# Patient Record
Sex: Female | Born: 1965 | Race: White | Hispanic: No | Marital: Married | Smoking: Never smoker
Health system: Southern US, Community
[De-identification: ages and names within clinical notes are randomized; demographics above are authoritative.]

## PROBLEM LIST (undated history)

## (undated) DIAGNOSIS — H409 Unspecified glaucoma: Secondary | ICD-10-CM

## (undated) DIAGNOSIS — M8580 Other specified disorders of bone density and structure, unspecified site: Secondary | ICD-10-CM

## (undated) DIAGNOSIS — T7840XA Allergy, unspecified, initial encounter: Secondary | ICD-10-CM

## (undated) DIAGNOSIS — D649 Anemia, unspecified: Secondary | ICD-10-CM

## (undated) DIAGNOSIS — H269 Unspecified cataract: Secondary | ICD-10-CM

## (undated) DIAGNOSIS — E785 Hyperlipidemia, unspecified: Secondary | ICD-10-CM

## (undated) HISTORY — DX: Unspecified cataract: H26.9

## (undated) HISTORY — DX: Other specified disorders of bone density and structure, unspecified site: M85.80

## (undated) HISTORY — DX: Allergy, unspecified, initial encounter: T78.40XA

## (undated) HISTORY — DX: Unspecified glaucoma: H40.9

## (undated) HISTORY — DX: Anemia, unspecified: D64.9

## (undated) HISTORY — PX: RETINAL DETACHMENT SURGERY: SHX105

## (undated) HISTORY — DX: Hyperlipidemia, unspecified: E78.5

---

## 2001-11-22 ENCOUNTER — Other Ambulatory Visit: Admission: RE | Admit: 2001-11-22 | Discharge: 2001-11-22 | Payer: Self-pay | Admitting: Family Medicine

## 2002-01-09 ENCOUNTER — Ambulatory Visit (HOSPITAL_COMMUNITY): Admission: RE | Admit: 2002-01-09 | Discharge: 2002-01-09 | Payer: Self-pay | Admitting: *Deleted

## 2002-12-11 ENCOUNTER — Encounter: Admission: RE | Admit: 2002-12-11 | Discharge: 2002-12-11 | Payer: Self-pay | Admitting: Obstetrics and Gynecology

## 2003-02-27 ENCOUNTER — Inpatient Hospital Stay (HOSPITAL_COMMUNITY): Admission: AD | Admit: 2003-02-27 | Discharge: 2003-03-04 | Payer: Self-pay | Admitting: Obstetrics and Gynecology

## 2003-04-08 ENCOUNTER — Other Ambulatory Visit: Admission: RE | Admit: 2003-04-08 | Discharge: 2003-04-08 | Payer: Self-pay | Admitting: Obstetrics and Gynecology

## 2004-06-16 ENCOUNTER — Other Ambulatory Visit: Admission: RE | Admit: 2004-06-16 | Discharge: 2004-06-16 | Payer: Self-pay | Admitting: Obstetrics and Gynecology

## 2004-06-26 ENCOUNTER — Encounter: Admission: RE | Admit: 2004-06-26 | Discharge: 2004-06-26 | Payer: Self-pay | Admitting: Obstetrics and Gynecology

## 2004-12-11 ENCOUNTER — Other Ambulatory Visit: Admission: RE | Admit: 2004-12-11 | Discharge: 2004-12-11 | Payer: Self-pay | Admitting: Obstetrics and Gynecology

## 2004-12-22 ENCOUNTER — Encounter: Admission: RE | Admit: 2004-12-22 | Discharge: 2004-12-22 | Payer: Self-pay | Admitting: Obstetrics and Gynecology

## 2007-04-13 ENCOUNTER — Encounter: Admission: RE | Admit: 2007-04-13 | Discharge: 2007-04-13 | Payer: Self-pay | Admitting: Obstetrics and Gynecology

## 2007-06-06 ENCOUNTER — Inpatient Hospital Stay (HOSPITAL_COMMUNITY): Admission: RE | Admit: 2007-06-06 | Discharge: 2007-06-08 | Payer: Self-pay | Admitting: Obstetrics and Gynecology

## 2009-12-31 ENCOUNTER — Encounter: Admission: RE | Admit: 2009-12-31 | Discharge: 2010-03-06 | Payer: Self-pay | Admitting: Endocrinology

## 2010-06-27 ENCOUNTER — Encounter: Payer: Self-pay | Admitting: Obstetrics and Gynecology

## 2010-06-27 ENCOUNTER — Other Ambulatory Visit (HOSPITAL_COMMUNITY): Payer: Self-pay | Admitting: Obstetrics and Gynecology

## 2010-06-27 DIAGNOSIS — O24919 Unspecified diabetes mellitus in pregnancy, unspecified trimester: Secondary | ICD-10-CM

## 2010-07-13 ENCOUNTER — Ambulatory Visit (HOSPITAL_COMMUNITY)
Admission: RE | Admit: 2010-07-13 | Discharge: 2010-07-13 | Disposition: A | Discharge status: Home / Self Care | Payer: BC Managed Care – PPO | Source: Ambulatory Visit | Attending: Obstetrics and Gynecology | Admitting: Obstetrics and Gynecology

## 2010-07-13 DIAGNOSIS — O34219 Maternal care for unspecified type scar from previous cesarean delivery: Secondary | ICD-10-CM | POA: Insufficient documentation

## 2010-07-13 DIAGNOSIS — O09529 Supervision of elderly multigravida, unspecified trimester: Secondary | ICD-10-CM | POA: Insufficient documentation

## 2010-07-13 DIAGNOSIS — O99210 Obesity complicating pregnancy, unspecified trimester: Secondary | ICD-10-CM | POA: Insufficient documentation

## 2010-07-13 DIAGNOSIS — Z363 Encounter for antenatal screening for malformations: Secondary | ICD-10-CM | POA: Insufficient documentation

## 2010-07-13 DIAGNOSIS — E669 Obesity, unspecified: Secondary | ICD-10-CM | POA: Insufficient documentation

## 2010-07-13 DIAGNOSIS — O358XX0 Maternal care for other (suspected) fetal abnormality and damage, not applicable or unspecified: Secondary | ICD-10-CM | POA: Insufficient documentation

## 2010-07-13 DIAGNOSIS — Z1389 Encounter for screening for other disorder: Secondary | ICD-10-CM | POA: Insufficient documentation

## 2010-07-13 DIAGNOSIS — O99810 Abnormal glucose complicating pregnancy: Secondary | ICD-10-CM | POA: Insufficient documentation

## 2010-07-13 DIAGNOSIS — O24919 Unspecified diabetes mellitus in pregnancy, unspecified trimester: Secondary | ICD-10-CM

## 2010-10-20 NOTE — Op Note (Signed)
NAMEGREIDY, Tara Oliver                   ACCOUNT NO.:  [id number]   MEDICAL RECORD NO.:  [id number]           PATIENT TYPE:   LOCATION:                                 FACILITY:   PHYSICIAN:  Michelle L. Grewal, M.D.DATE OF BIRTH:  09/28/1965   DATE OF PROCEDURE:  06/06/2007  DATE OF DISCHARGE:                               OPERATIVE REPORT   PREOPERATIVE DIAGNOSES:  1. Intrauterine pregnancy at 39 weeks.  2. Previous Cesarean section.  3. Gestational diabetes.   POSTOPERATIVE DIAGNOSES:  1. Intrauterine pregnancy at 39 weeks.  2. Previous Cesarean section.  3. Gestational diabetes.   PROCEDURE:  Repeat low transverse cesarean section.   SURGEON:  Michelle L. Vincente Poli, M.D.   ASSISTANT:  Guy Sandifer. Henderson Cloud, M.D.   ANESTHESIA:  Spinal.   FINDINGS:  Female infant, Apgars 9 at one minute and 9 at five minutes.   ESTIMATED BLOOD LOSS:  Estimated blood loss 500 mL.   DRAINS:  Foley.   PROCEDURE:  Patient taken to the operating room where spinal was placed  by Dr. Jean Rosenthal and she was prepped and draped.  A low transverse  incision was made after Foley catheter was inserted.  It was carried  down to the fascia.  The fascia was scored in the midline and extended  laterally.  The rectus muscles were separated in the midline and the  peritoneum was entered bluntly.  Peritoneal incision was then stretched.  The lower uterine segment was identified.  The bladder flap was created  sharply and then digitally.   A low transverse incision was made in the uterus.  The uterus was  entered using a hemostat.  Amniotic fluid was clear.  The baby was in  cephalic presentation and delivered easily.  The cord was clamped and  cut.  Cord blood was obtained.  The placenta was manually removed and  noted be normal, intact with three-vessel cord.  Antibiotics and Pitocin  had been given.  The baby was a female infant, Apgars 9 at one minute  and 9 at five minutes and was cared for by the neonatal team in  the  operating room and subsequently taken to newborn nursery.  The baby was  very vigorous in the delivery room.   After the uterus was exteriorized and cleared of all clots and debris,  it was closed in two-layers used using 0 chromic in continuous running  locked stitch.  Hemostasis was excellent.  The uterus was returned to  the abdomen.  Irrigation was performed.  Hemostasis was again noted.  The peritoneum was closed using 0  Vicryl.  The fascia was then  closed using 0 Vicryl in running stitch starting at each corner and  meeting in the midline.  After irrigation of subcutaneous layer, the  skin was closed with staples.  All sponge, lap and instrument counts  were correct x2.  The patient went to recovery room in stable condition.      Michelle L. Vincente Poli, M.D.  Electronically Signed     MLG/MEDQ  D:  06/06/2007  T:  06/06/2007  Job:  [id number]

## 2010-10-23 NOTE — Discharge Summary (Signed)
Tara Oliver, CURIALE                   ACCOUNT NO.:  [id number]   MEDICAL RECORD NO.:  [id number]          PATIENT TYPE:  INP   LOCATION:  9113                          FACILITY:  [facility]   PHYSICIAN:  Duke Salvia. Marcelle Overlie, M.D.DATE OF BIRTH:  [birth year]   DATE OF ADMISSION:  06/06/2007  DATE OF DISCHARGE:  06/08/2007                               DISCHARGE SUMMARY   ADMITTING DIAGNOSES:  1. Intrauterine pregnancy at term.  2. Previous cesarean section, desires repeat.  3. Gestational diabetes, diet controlled.   DISCHARGE DIAGNOSES:  1. Status post low transverse cesarean section.  2. Viable female infant.   PROCEDURE:  Repeat low transverse cesarean section.   REASON FOR ADMISSION:  Please see written H&P.   HOSPITAL COURSE:  The patient is a 45 year old, gravida 5, para 1-0-3-1  who presents to [facility] for a scheduled cesarean  section.  The patient had had a previous cesarean delivery and desired  repeat.  During this pregnancy, the patient had gestational diabetes  which was diet controlled.  On admission, vital signs were stable.  Fetal heart tones were reactive.   The patient was then transferred to the operating room where spinal  anesthesia was administered without difficulty.  A low transverse  incision was made with delivery of a viable female infant weighing 8  pounds 13 ounces with Apgars of 9 at one minute and 9 at five minutes.  The patient tolerated the procedure well and was taken to the recovery  room in stable condition.   On postoperative day #1, the patient was without complaint.  Vital signs  were stable.  She was afebrile.  Abdomen soft.  Fundus firm and  nontender.  Abdominal dressing was noted to be clean, dry and intact.   On postoperative day #2, the patient did desire early discharge.  She  was without complaint.  Vital signs were stable.  Abdomen soft.  Fundus  firm and nontender.  Incision was clean, dry and intact.  Laboratory  findings revealed hemoglobin of 11.2.   Discharge instructions were reviewed and the patient was later  discharged home.   CONDITION ON DISCHARGE:  Stable.   DIET:  Regular as tolerated.   ACTIVITY:  No heavy lifting, no driving x2 weeks, no vaginal entry.   FOLLOW UP:  Patient is to follow up in the office in two to three days  for staple removal.  She is to call for temperature greater than 100  degrees, persistent nausea, vomiting, heavy vaginal bleeding or redness  or drainage from the incisional site.   DISCHARGE MEDICATIONS:  1. Tylox #30, one p.o. every 4 to 6 hours.  2. Motrin 600 mg every 6 hours.  3. Prenatal vitamins one p.o. daily  4. Colace one p.o. daily.      Julio Sicks, N.P.      Richard M. Marcelle Overlie, M.D.  Electronically Signed    CC/MEDQ  D:  06/24/2007  T:  06/24/2007  Job:  [id number]

## 2010-10-23 NOTE — Discharge Summary (Signed)
NAMEJAXIE, RACANELLI                             ACCOUNT NO.:  [id number]   MEDICAL RECORD NO.:  [id number]                   PATIENT TYPE:  INP   LOCATION:  9104                                 [facility]ACILITY:  [facility]   PHYSICIAN:  Guy Sandifer. Henderson Cloud, M.D.              DATE OF BIRTH:  31-Oct-1965   DATE OF ADMISSION:  02/27/2003  DATE OF DISCHARGE:  03/04/2003                                 DISCHARGE SUMMARY   ADMITTING DIAGNOSES:  1. Intrauterine pregnancy at 90 weeks estimated gestational age.  2. Gestational diabetes.  3. Large for gestational age.   DISCHARGE DIAGNOSES:  1. Status post low transverse cesarean section secondary to failure to     progress.  2. Viable female infant.   PROCEDURE:  Primary low transverse cesarean section.   REASON FOR ADMISSION:  Please see dictated H&P.   HOSPITAL COURSE:  The patient was a 45 year old white married female gravida  2 para 0 that was admitted to [facility] for a two stage  induction.  Pregnancy had been complicated by gestational diabetes which had  been controlled with diet.  Ultrasound which was performed on day prior to  admission had estimated fetal weight of 3800 grams which was in the 94th  percentile.  Cervix was noted to be a fingertip dilated, 50% effaced, at a -  2 station.  Blood pressure was normal at 120/72 with 1+ proteinuria.  PIH  labs were drawn which were within normal limits.  The patient was noted to  be positive for group B beta strep and antibiotics were administered per  protocol.  On the evening of admission Cytotec was administered for ripening  of the cervix.  Contractions were noted to be every two to five minutes.  There was a spontaneous deceleration while the patient was lying on her  right side.  IV fluid bolus was given.  External fetal monitoring was  continued with reactivity without decelerations.  No further Cytotec was  given.  On the following morning fetal heart tones were reassuring  and  Pitocin was started per low-dose Pitocin protocol.  The cervix was now  dilated to 2 cm, 80% effaced, at a -2 station.  Artificial rupture of  membranes was performed revealing clear fluid.  Low-dose Pitocin was  continued throughout the day.  Fetal heart tones were reactive.  Intrauterine pressure catheter was placed later that evening without noted  change in the cervix.  Epidural had been placed for the patient's comfort.  After several hours of documented adequate labor without change in cervix,  decision was made to proceed with a low transverse cesarean section. The  patient was transferred to the operating room where epidural was dosed to an  adequate surgical level.  A low transverse incision was made with the  delivery of a viable female infant weighing 8 pounds 11 ounces with Apgars  of  8 at one minute and 10 at five minutes.  The patient tolerated the  procedure well and was taken to the recovery room in stable condition.  On  postoperative day #1 vital signs were stable.  The patient was afebrile,  blood pressure 100/70.  Abdomen was soft with faint bowel sounds.  Fundus  was firm, U-even, and nontender.  Abdominal dressing was noted to be  partially saturated.  Abdominal dressing was partially removed without  active bleeding noted.  Lungs were clear to auscultation.  Labs revealed  hemoglobin of 10.7; platelet count of 187,000; wbc count of 12.1.  Abdominal  dressing was removed and replaced.  On postoperative day #2 vital signs were  stable.  The patient remained afebrile.  Abdomen was soft.  She had adequate  return of bowel function.  Fundus was firm and nontender.  Abdominal  dressing was removed revealing an incision that was clean, dry, and intact.  The patient was ambulating well and tolerating a regular diet without  complaints of nausea and vomiting.  On postoperative day #3 the patient was  doing well.  Abdomen was soft.  Fundus was firm and nontender.  Incision  was  clean, dry, and intact.  The baby was having some difficulty with  hypoglycemia.  On postoperative day #4 the patient did complain of a lump in  the right axilla.  Vital signs were stable, abdomen soft.  Incision was  clean, dry, and intact.  Staples were removed.  Palpable area was 2-3 cm in  the right axilla, slightly tender, without erythema, was questionable  clogged milk duct.  Warm compresses were applied and breast massage  instructions were given, and the patient was discharged home.   CONDITION ON DISCHARGE:  Good.   DIET:  Regular as tolerated..   ACTIVITY:  No heavy lifting, no driving x2 weeks, no vaginal entry.   FOLLOW-UP:  The patient is to follow up in the office in one week for an  incision check and a repeat breast exam.  She is to call for temperature  greater than 100 degrees, persistent nausea and vomiting, heavy vaginal  bleeding, and/or redness or drainage from the incisional site.   DISCHARGE MEDICATIONS:  1. Darvocet-N 100 #30 one p.o. q.4-6h. p.r.n.  2. Motrin 600 mg q.6h.  3. Prenatal vitamins one p.o. daily.  4. Colace one p.o. daily p.r.n.     Julio Sicks, N.P.                        Guy Sandifer. Henderson Cloud, M.D.    CC/MEDQ  D:  03/26/2003  T:  03/26/2003  Job:  [id number]

## 2010-10-23 NOTE — H&P (Signed)
   NAMEKALINDA, ROMANIELLO                             ACCOUNT NO.:  [id number]   MEDICAL RECORD NO.:  [id number]                   PATIENT TYPE:  INP   LOCATION:  9168                                 [facility]ACILITY:  [facility]   PHYSICIAN:  Guy Sandifer. Arleta Creek, M.D.           DATE OF BIRTH:  1965-08-23   DATE OF ADMISSION:  02/27/2003  DATE OF DISCHARGE:                                HISTORY & PHYSICAL   CHIEF COMPLAINT:  Gestational diabetes.   HISTORY OF PRESENT ILLNESS:  This patient is a 45 year old, married, white  female, G2, P0, with an EDC of March 13, 2003, who has had gestational  diabetes with good diet control.  An ultrasound on February 26, 2003,  revealed a vertex baby with an estimated fetal weight of 3836 g, which is  94th percentile.  The amniotic fluid index was 45th percentile.  The cervix  was fingertip, 50% effaced, and -2 station.  In the office today, the blood  pressure was 120/72 and there was 1+ protein in the urine.  The patient  denies CNS changes or epigastric pain.   PAST MEDICAL HISTORY:  See prenatal history and physical.   PAST SURGICAL HISTORY:  See prenatal history and physical.   SOCIAL HISTORY:  See prenatal history and physical.   FAMILY HISTORY:  See prenatal history and physical.   MEDICATIONS:  Prenatal vitamins.   ALLERGIES:  ERYTHROMYCIN leading to nausea and vomiting.   LABORATORY DATA:  Blood type B positive.  Rh antibody screen negative.  RPR  nonreactive.  Rubella immune.  Hepatitis B surface antigen negative.  Gonorrhea and chlamydia.  BBD nonreactive.   ASSESSMENT:  1. Intrauterine pregnancy at 1 weeks estimated gestational age.  2. Gestational diabetes.  3. Rule out preeclampsia.   PLAN:  Will check pH labs.  The patient is group B Streptococcus positive.  Will begin antibiotics per protocol.  Will begin two-stage induction.                                               Guy Sandifer Arleta Creek, M.D.    JET/MEDQ  D:  02/27/2003  T:   02/27/2003  Job:  [id number]

## 2010-10-23 NOTE — Op Note (Signed)
Tara Oliver, Tara Oliver                             ACCOUNT NO.:  [id number]   MEDICAL RECORD NO.:  [id number]                   PATIENT TYPE:  INP   LOCATION:  9104                                 [facility]   PHYSICIAN:  Michelle L. Vincente Poli, M.D.            DATE OF BIRTH:  Mar 28, 1966   DATE OF PROCEDURE:  02/28/2003  DATE OF DISCHARGE:                                 OPERATIVE REPORT   PREOPERATIVE DIAGNOSES:  1. Intrauterine pregnancy at 38 weeks.  2. Gestational diabetes.  3. Large for gestational age.  4. Failure to progress.   POSTOPERATIVE DIAGNOSES:  1. Intrauterine pregnancy at 38 weeks.  2. Gestational diabetes.  3. Large for gestational age.  4. Failure to progress.   PROCEDURE:  Primary low-transverse cesarean section.   SURGEON:  Michelle L. Vincente Poli, M.D.   FINDINGS:  Female infant, APGARS 9 at one minute, 10 at five minutes,  weighing 8 pounds 11 ounces.   DESCRIPTION OF PROCEDURE:  The patient was taken to the operating room, her  epidural was dosed, she was prepped and draped in the usual sterile fashion.  A Foley catheter was already in the bladder.  A low-transverse incision was  made and carried down to the fascia.  The fascia was scored in the midline  and extended laterally.  The Pfannenstiel incision was then performed.  The  rectus muscles were separated in the midline.  The peritoneum was then  entered bluntly.  The peritoneal incision was then stretched.  The bladder  blade was inserted.  The lower uterine segment was identified and the  bladder flap was created sharply and then digitally.   A low-transverse incision was made in the uterus and the uterus was entered  with a hemostat.  The baby was in cephalic presentation, was delivered  easily.  She was a female infant with APGARS 9 at one minute, 10 at five  minutes and weighed 8 pounds 11 ounces.  The cord was clamped and cut and  the baby was handed to the awaiting pediatricians.  The placenta  was  manually removed and noted to be normal and intact.  The uterus was  exteriorized and cleared of all clots and debris.  The uterine incision was  closed in a single layer using #0 chromic in continuous running locked  stitch.  The uterus was returned to the abdomen.  The peritoneum was then  closed using #0 Vicryl in a continuous running stitch and the same was used  to reapproximate the rectus muscle.  The fascia was closed using #0 Vicryl  in continuous running stitch starting at each corner and meeting in the  midline.  Plain gut suture was then  used, interrupted, to close the subcutaneous layer and the skin was closed  with staples.  All sponge, laparotomy, and instrument counts were correct  x2.   The patient tolerated the procedure  well and went to the recovery room in  stable condition.                                               Michelle L. Vincente Poli, M.D.    [name]  D:  02/28/2003  T:  03/01/2003  Job:  [id number]

## 2010-11-30 ENCOUNTER — Encounter (HOSPITAL_COMMUNITY)
Admission: RE | Admit: 2010-11-30 | Discharge: 2010-11-30 | Disposition: A | Discharge status: Home / Self Care | Payer: BC Managed Care – PPO | Source: Ambulatory Visit | Attending: Obstetrics and Gynecology | Admitting: Obstetrics and Gynecology

## 2010-11-30 LAB — BASIC METABOLIC PANEL
BUN: 8 mg/dL (ref 6–23)
Calcium: 9.4 mg/dL (ref 8.4–10.5)
GFR calc Af Amer: >60 mL/min (ref >60)
GFR calc non Af Amer: >60 mL/min (ref >60)
Glucose, Bld: 81 mg/dL (ref 70–99)
Potassium: 3.7 mEq/L (ref 3.5–5.1)
Sodium: 135 mEq/L (ref 135–145)

## 2010-11-30 LAB — CBC
HCT: 34.7 % — ABNORMAL LOW (ref 36.0–46.0)
Hemoglobin: 11.8 g/dL — ABNORMAL LOW (ref 12.0–15.0)
MCH: 29.4 pg (ref 26.0–34.0)
MCHC: 34 g/dL (ref 30.0–36.0)
MCV: 86.5 fL (ref 78.0–100.0)
Platelets: 273 10*3/uL (ref 150–400)
RBC: 4.01 MIL/uL (ref 3.87–5.11)
RDW: 13.5 % (ref 11.5–15.5)
WBC: 8.7 10*3/uL (ref 4.0–10.5)

## 2010-11-30 LAB — RPR: RPR Ser Ql: NONREACTIVE

## 2010-12-08 ENCOUNTER — Inpatient Hospital Stay (HOSPITAL_COMMUNITY)
Admission: RE | Admit: 2010-12-08 | Discharge: 2010-12-10 | DRG: 370 | Disposition: A | Discharge status: Home / Self Care | Payer: BC Managed Care – PPO | Source: Ambulatory Visit | Attending: Obstetrics and Gynecology | Admitting: Obstetrics and Gynecology

## 2010-12-08 ENCOUNTER — Other Ambulatory Visit: Payer: Self-pay | Admitting: Obstetrics and Gynecology

## 2010-12-08 DIAGNOSIS — O34219 Maternal care for unspecified type scar from previous cesarean delivery: Principal | ICD-10-CM | POA: Diagnosis present

## 2010-12-08 DIAGNOSIS — E119 Type 2 diabetes mellitus without complications: Secondary | ICD-10-CM | POA: Diagnosis present

## 2010-12-08 DIAGNOSIS — O09529 Supervision of elderly multigravida, unspecified trimester: Secondary | ICD-10-CM | POA: Diagnosis present

## 2010-12-08 DIAGNOSIS — Z01812 Encounter for preprocedural laboratory examination: Secondary | ICD-10-CM

## 2010-12-08 DIAGNOSIS — Z01818 Encounter for other preprocedural examination: Secondary | ICD-10-CM

## 2010-12-08 DIAGNOSIS — O2432 Unspecified pre-existing diabetes mellitus in childbirth: Secondary | ICD-10-CM | POA: Diagnosis present

## 2010-12-08 DIAGNOSIS — Z302 Encounter for sterilization: Secondary | ICD-10-CM

## 2010-12-08 LAB — GLUCOSE, CAPILLARY
Glucose-Capillary: 85 mg/dL (ref 70–99)
Glucose-Capillary: 77 mg/dL (ref 70–99)
Glucose-Capillary: 75 mg/dL (ref 70–99)
Glucose-Capillary: 90 mg/dL (ref 70–99)

## 2010-12-08 LAB — TYPE AND SCREEN

## 2010-12-08 LAB — ABO/RH: ABO/RH(D): B POS

## 2010-12-09 LAB — GLUCOSE, CAPILLARY
Glucose-Capillary: 72 mg/dL (ref 70–99)
Glucose-Capillary: 121 mg/dL — ABNORMAL HIGH (ref 70–99)

## 2010-12-09 LAB — CBC
HCT: 30.6 % — ABNORMAL LOW (ref 36.0–46.0)
Hemoglobin: 10.2 g/dL — ABNORMAL LOW (ref 12.0–15.0)
MCHC: 33.3 g/dL (ref 30.0–36.0)
RBC: 3.52 MIL/uL — ABNORMAL LOW (ref 3.87–5.11)
WBC: 7.9 10*3/uL (ref 4.0–10.5)

## 2010-12-10 ENCOUNTER — Inpatient Hospital Stay (HOSPITAL_COMMUNITY): Admission: AD | Admit: 2010-12-10 | Payer: Self-pay | Source: Ambulatory Visit | Admitting: Obstetrics and Gynecology

## 2010-12-10 LAB — GLUCOSE, CAPILLARY: Glucose-Capillary: 76 mg/dL (ref 70–99)

## 2010-12-15 NOTE — Op Note (Signed)
Tara Oliver, Tara Oliver                   ACCOUNT NO.:  [id number]  MEDICAL RECORD NO.:  [id number]  LOCATION:  9199                          FACILITY:  [facility]  PHYSICIAN:  Cecilia Vancleve L. Kiegan Macaraeg, M.D.DATE OF BIRTH:  [birth year]  DATE OF PROCEDURE:  12/08/2010 DATE OF DISCHARGE:                              OPERATIVE REPORT   PREOPERATIVE DIAGNOSES:  Intrauterine pregnancy at 39 plus, insulin- dependent diabetes, previous cesarean section x2, and desires permanent sterilization.  POSTOPERATIVE DIAGNOSES:  Intrauterine pregnancy at 39 plus, insulin- dependent diabetes, previous cesarean section x2, and desires permanent sterilization.  PROCEDURE:  Repeat low transverse cesarean section and bilateral tubal ligation with Filshie clips  SURGEON:  Takeo Harts L. Jary Louvier, MD  ANESTHESIA:  Spinal.  FINDINGS:  Female infant, cephalic presentation, Apgars  9 at 1 minute and 9 at 5 minutes.  PATHOLOGY:  None.  DRAINS:  Foley.  ESTIMATED BLOOD LOSS:  Less than 500 mL.  COMPLICATIONS:  None.  PROCEDURE:  The patient was taken to the operating room where spinal was placed.  She was prepped and draped.  A Foley catheter was inserted.  A low transverse incision was made and carried down to the fascia.  Fascia was scored in the midline and extended laterally.  The rectus muscles were separated in the midline.  The peritoneum was entered bluntly.  The peritoneal incision was then stretched.  The bladder blade was inserted. The bladder flap was then created sharply and then digitally.  The bladder blade was then readjusted.  A low transverse incision was made in the uterus.  Uterus was entered using a hemostat.  The baby was in cephalic presentation and delivered easily with one gentle pull of the vacuum.  The baby was a female infant, Apgars 9 at 1 minute and 9 at 5 minutes.  There was a loose nuchal cord x1.  The cord was clamped and the baby was handed to the awaiting neonatal team and taken to  the Newborn Nursery.  The placenta was manually removed and noted to be normal intact with a three-vessel cord after cord blood was obtained. Pitocin and antibiotics were given.  The uterus was exteriorized.  It was cleared of all clots and debris.  The uterine incision was closed in two layers using 0 chromic in a running locked stitch.  The uterus was firm.  At this point, we performed bilateral tubal ligation with Filshie clips by placing them in the midportion of each fallopian tube.  The fimbriated end of each tube was visualized.  The uterus was returned to the abdomen.  Irrigation was performed.  Hemostasis was excellent.  The peritoneum was closed using 0 Vicryl.  The rectus muscles were closed with 0 Vicryl.  The fascia was closed using 0 Vicryl starting each corner and meeting in the midline.  After irrigation of subcutaneous layer, the subcutaneous layer was closed with interrupted using plain gut and the skin was closed with staples.  All sponge, lap, and instrument counts were correct x2.  The patient went to the recovery room in stable condition.     Abrar Bilton L. Vincente Poli, M.D.     [name]  D:  12/08/2010  T:  12/08/2010  Job:  [id number]  Electronically Signed by Marcelle Overlie M.D. on 12/15/2010 02:01:18 PM

## 2010-12-30 NOTE — Discharge Summary (Signed)
  [name], Tara Oliver                   ACCOUNT NO.:  [id number]  MEDICAL RECORD NO.:  [id number]  LOCATION:  9110                          [facility]ACILITY:  [facility]  PHYSICIAN:  Zelphia Cairo, MD    DATE OF BIRTH:  07-06-1965  DATE OF ADMISSION:  12/08/2010 DATE OF DISCHARGE:  12/10/2010                              DISCHARGE SUMMARY   ADMITTING DIAGNOSES: 1. Intrauterine pregnancy at 20 weeks' estimated gestational age. 2. Previous cesarean section, desires repeat. 3. Multiparity, desires permanent sterilization.  DISCHARGE DIAGNOSES: 1. Status post low transverse cesarean section. 2. Viable female infant. 3. Permanent sterilization. 4. Insulin-dependent diabetes.  PROCEDURES: 1. Repeat low transverse cesarean section. 2. Bilateral tubal ligation.  REASON FOR ADMISSION:  Please see written H and P.  HOSPITAL COURSE:  The patient is a 45 year old, gravida 6, para 2-0-3-2 who was admitted to [facility] at 62 weeks' estimated gestational age for scheduled cesarean section.  Due to multiparity, the patient had also requested permanent sterilization.  The patient's pregnancy had been complicated by insulin-dependent diabetes, on insulin with good control.  On the morning of admission, the patient was taken to the operating room where spinal anesthesia was administered without difficulty.  A low transverse incision was made with delivery of a viable female infant weighing 7 and pounds 3 ounces with Apgars of 9 at 1 minute and 9 at 5 minutes.  Bilateral tubal ligation was performed without difficulty.  The patient was then transferred to the recovery room in stable condition.  On postoperative day #1, the patient was without complaint.  Vital signs were stable.  Abdomen was soft and nontender.  Abdominal dressing were noted to be clean, dry, and intact. CBGs revealed values of 72 mg/dL to 295 mg/dL.  Hemoglobin was 10.2.  On postoperative day #2, the patient desired early  discharge.  Vital signs were stable.  Abdomen was soft with good return of bowel function. Incision was clean, dry, and intact.  Discharge instructions were reviewed and the patient was later discharged home.  CONDITION ON DISCHARGE:  Stable.  DIET:  Regular as tolerated.  ACTIVITY:  No heavy lifting, no driving x2 weeks, no vaginal entry.  FOLLOWUP:  The patient is to follow up in the office in 2-3 days for incision check.  She is to call for temperature greater than 100 degrees, persistent vomiting, heavy vaginal bleeding, and/or redness or drainage from incisional site.  DISCHARGE MEDICATIONS: 1. Tylox, #30, 1 p.o. every 4-6 hours p.r.n. 2. Motrin 600 mg every 6 hours. 3. Metformin 500 mg 1 p.o. b.i.d. 4. Colace as needed.     Julio Sicks, N.P.   ______________________________ Zelphia Cairo, MD    CC/MEDQ  D:  12/10/2010  T:  12/10/2010  Job:  [id number]  Electronically Signed by Julio Sicks N.P. on 12/10/2010 10:10:55 AM Electronically Signed by Zelphia Cairo MD on 12/30/2010 05:14:57 PM

## 2011-03-12 LAB — BASIC METABOLIC PANEL
BUN: 6
Creatinine, Ser: 0.57
GFR calc non Af Amer: >60
Glucose, Bld: 109 — ABNORMAL HIGH
Potassium: 3.8

## 2011-03-12 LAB — CCBB MATERNAL DONOR DRAW

## 2011-03-12 LAB — CBC
HCT: 35.1 — ABNORMAL LOW
MCHC: 35
Platelets: 299
RBC: 3.71 — ABNORMAL LOW
RDW: 13.7
RDW: 13.7
WBC: 7.6

## 2011-03-12 LAB — RPR: RPR Ser Ql: NONREACTIVE

## 2011-10-07 ENCOUNTER — Encounter: Payer: Self-pay | Admitting: *Deleted

## 2011-10-07 ENCOUNTER — Encounter: Payer: BC Managed Care – PPO | Attending: Endocrinology | Admitting: *Deleted

## 2011-10-07 DIAGNOSIS — E119 Type 2 diabetes mellitus without complications: Secondary | ICD-10-CM | POA: Insufficient documentation

## 2011-10-07 DIAGNOSIS — Z713 Dietary counseling and surveillance: Secondary | ICD-10-CM | POA: Insufficient documentation

## 2011-10-07 NOTE — Patient Instructions (Signed)
Plan: Aim for 2-3 Carb choices (30-45 grams) per meal, 0-1 per snack if hungry Use 1/2 cup measuring cup for starch foods at dinner Read Food Labels for Total Carb and Fat grams Consider ways to increase activity (aerobic) for 15 minutes daily at time attached to other planned activity (shower or dinner meal) Continue to check BG daily, consider alternating pre and post meal occasionally

## 2011-10-07 NOTE — Progress Notes (Signed)
  Medical Nutrition Therapy:  Appt start time: 0800 end time:  0900.  Assessment:  Primary concerns today: patient here for diabetes education including carb counting update and for obesity. 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  MEDICATIONS: see list, diabetes medication is Metformin ER   DIETARY INTAKE:  Usual eating pattern includes 3 meals and 1-2 snacks per day.  Everyday foods include good variety of all food groups.  Avoided foods include typically sweets.    24-hr recall:  B ( AM): mini bagel with cheese or PNB, OR hot cereal OR cereal with skim milk, coffee with Splenda and cream  Snk ( AM): occasionally fresh fruit or almonds OR yogurt  L ( PM): bring left overs OR salad OR frozen low cal dinner Or sandwich OR sandwich with chips or carrots, water or diet soda Snk ( PM): same as AM, either or, not both D ( PM): both prepare dinner, eat out 1-2 times a week, child friendly foods, including a vegetable or fruit, decaf iced tea unsweetened Snk ( PM): occasionally graham cracker with PNB, 12 oz skim milk Beverages: coffee, water, diet soda, decaf unsweetened tea, skim milk  Usual physical activity: WII at home once a week x 15 minutes, walk in evening with family, has power walked at mall in past  Estimated energy needs: 1200 calories 135 g carbohydrates 90 g protein 33 g fat  Progress Towards Goal(s):  In progress.   Nutritional Diagnosis:  NI-1.5 Excessive energy intake As related to activity level.  As evidenced by BMI of 37.7%.    Intervention:  Nutrition counseling and diabetes eduction provided, emphasizing carb counting and reading food labels for total carb and fat grams. Also encouraged increasing activity level to at least 15 minutes daily to assist with weight loss. Finally addressed SMBG and suggested she continue checking daily but to consider alternating between pre  and post meals. Plan: Aim for 2-3 Carb choices (30-45 grams) per meal, 0-1 per snack if hungry Use 1/2 cup measuring cup for starch foods at dinner Read Food Labels for Total Carb and Fat grams Consider ways to increase activity (aerobic) for 15 minutes daily at time attached to other planned activity (shower or dinner meal) Continue to check BG daily, consider alternating pre and post meal occasionally  Handouts given during visit include: Living Well with Diabetes Carb Counting and Food Label handouts Meal Plan Card  Monitoring/Evaluation:  Dietary intake, exercise, reading food labels, and body weight in 3 month(s).

## 2012-01-10 ENCOUNTER — Encounter: Payer: BC Managed Care – PPO | Attending: Endocrinology | Admitting: *Deleted

## 2012-01-10 ENCOUNTER — Encounter: Payer: Self-pay | Admitting: *Deleted

## 2012-01-10 DIAGNOSIS — Z713 Dietary counseling and surveillance: Secondary | ICD-10-CM | POA: Insufficient documentation

## 2012-01-10 DIAGNOSIS — E119 Type 2 diabetes mellitus without complications: Secondary | ICD-10-CM | POA: Insufficient documentation

## 2012-01-10 NOTE — Progress Notes (Signed)
  Medical Nutrition Therapy:  Appt start time: 1200 end time:  1230.  Assessment:  Primary concerns today: patient here for follow up visit for diabetes education including carb counting update and for obesity. She states her stress at work has decreased with a new boss coming in. She also states she is walking with her friends once again for 30 or more minutes several times a week. She is doing well with carb counting and has lost 4 pounds since last visit.   MEDICATIONS: see list, diabetes medication is Metformin ER   DIETARY INTAKE:  Usual eating pattern includes 3 meals and 1-2 snacks per day.  Everyday foods include good variety of all food groups.  Avoided foods include typically sweets.    24-hr recall:  B ( AM): mini bagel with cheese or PNB, OR hot cereal OR cereal with skim milk, coffee with Splenda and cream  Snk ( AM): occasionally fresh fruit or almonds OR yogurt  L ( PM): bring left overs OR salad OR frozen low cal dinner Or sandwich OR sandwich with chips or carrots, water or diet soda Snk ( PM): same as AM, either or, not both D ( PM): both prepare dinner, eat out 1-2 times a week, child friendly foods, including a vegetable or fruit, decaf iced tea unsweetened Snk ( PM): occasionally graham cracker with PNB, 12 oz skim milk Beverages: coffee, water, diet soda, decaf unsweetened tea, skim milk  Usual physical activity: now walking with friends for 30 minutes around the mall briskly 2-3 times a week or during her lunch. She continues with family exercises with WII with her kids.   Estimated energy needs: 1200 calories 135 g carbohydrates 90 g protein 33 g fat  Progress Towards Goal(s):  In progress.   Nutritional Diagnosis:  NI-1.5 Excessive energy intake As related to activity level.  As evidenced by BMI of 37.0%.    Intervention:  Complimented her on her many successes with food choices and activity plan.  She plans to increase her SMBG to twice a day including a  post supper test. She is interested in keeping a Food / BG Log Sheet to better assess her progress. Plan: Continue to aim for 2-3 Carb choices (30-45 grams) per meal, 0-1 per snack if hungry Continue to use 1/2 cup measuring cup for starch foods at dinner Continue to read Food Labels for Total Carb and Fat grams Continue with increase in activity (aerobic) for 15 minutes daily at time attached to other planned activity (shower or dinner meal) Continue to check BG daily, consider alternating pre and post meal occasionally Look for Month of Meals book by [employer] for menu suggestions Continue with My Fitness Pal and consider Log Sheet to track food, BG, etc as needed  Monitoring/Evaluation:  Dietary intake, exercise, reading food labels, and body weight prn.

## 2012-01-10 NOTE — Patient Instructions (Addendum)
Plan: Continue to aim for 2-3 Carb choices (30-45 grams) per meal, 0-1 per snack if hungry Continue to use 1/2 cup measuring cup for starch foods at dinner Continue to read Food Labels for Total Carb and Fat grams Continue with increase in activity (aerobic) for 15 minutes daily at time attached to other planned activity (shower or dinner meal) Continue to check BG daily, consider alternating pre and post meal occasionally Look for Month of Meals book by [employer] for menu suggestions Continue with My Fitness Pal and consider Log Sheet to track food, BG, etc as needed

## 2012-01-11 ENCOUNTER — Other Ambulatory Visit: Payer: Self-pay | Admitting: Obstetrics and Gynecology

## 2012-01-18 ENCOUNTER — Other Ambulatory Visit: Payer: Self-pay | Admitting: Obstetrics and Gynecology

## 2012-01-18 DIAGNOSIS — R928 Other abnormal and inconclusive findings on diagnostic imaging of breast: Secondary | ICD-10-CM

## 2012-01-24 ENCOUNTER — Other Ambulatory Visit: Payer: Self-pay | Admitting: Dermatology

## 2012-01-25 ENCOUNTER — Other Ambulatory Visit: Payer: Self-pay | Admitting: Obstetrics and Gynecology

## 2012-01-25 ENCOUNTER — Ambulatory Visit
Admission: RE | Admit: 2012-01-25 | Discharge: 2012-01-25 | Disposition: A | Discharge status: Home / Self Care | Payer: BC Managed Care – PPO | Source: Ambulatory Visit | Attending: Obstetrics and Gynecology | Admitting: Obstetrics and Gynecology

## 2012-01-25 DIAGNOSIS — R928 Other abnormal and inconclusive findings on diagnostic imaging of breast: Secondary | ICD-10-CM

## 2012-01-31 ENCOUNTER — Other Ambulatory Visit: Payer: Self-pay | Admitting: Obstetrics and Gynecology

## 2012-01-31 ENCOUNTER — Ambulatory Visit
Admission: RE | Admit: 2012-01-31 | Discharge: 2012-01-31 | Disposition: A | Discharge status: Home / Self Care | Payer: BC Managed Care – PPO | Source: Ambulatory Visit | Attending: Obstetrics and Gynecology | Admitting: Obstetrics and Gynecology

## 2012-01-31 DIAGNOSIS — R928 Other abnormal and inconclusive findings on diagnostic imaging of breast: Secondary | ICD-10-CM

## 2013-03-12 ENCOUNTER — Other Ambulatory Visit: Payer: Self-pay | Admitting: Obstetrics and Gynecology

## 2014-04-24 ENCOUNTER — Other Ambulatory Visit: Payer: Self-pay | Admitting: Obstetrics and Gynecology

## 2014-04-26 LAB — CYTOLOGY - PAP

## 2016-10-04 DIAGNOSIS — K621 Rectal polyp: Secondary | ICD-10-CM | POA: Diagnosis not present

## 2016-10-04 DIAGNOSIS — D124 Benign neoplasm of descending colon: Secondary | ICD-10-CM | POA: Diagnosis not present

## 2017-07-15 ENCOUNTER — Encounter: Payer: Self-pay | Admitting: Gastroenterology

## 2017-08-30 ENCOUNTER — Other Ambulatory Visit: Payer: Self-pay

## 2017-08-30 ENCOUNTER — Ambulatory Visit (AMBULATORY_SURGERY_CENTER): Payer: Self-pay | Admitting: *Deleted

## 2017-08-30 VITALS — Ht 65.5 in | Wt 220.0 lb

## 2017-08-30 DIAGNOSIS — Z1211 Encounter for screening for malignant neoplasm of colon: Secondary | ICD-10-CM

## 2017-08-30 MED ORDER — PEG 3350-KCL-NA BICARB-NACL 420 G PO SOLR: 4000.0000 mL | Freq: Once | ORAL | 0 refills | Status: AC

## 2017-08-30 NOTE — Progress Notes (Signed)
No egg or soy allergy known to patient   issues with past sedation with any surgeries  or procedures PONV , no intubation problems  No diet pills per patient No home 02 use per patient  No blood thinners per patient  Pt denies issues with constipation  No A fib or A flutter  EMMI video sent to pt's e mail -

## 2017-09-13 ENCOUNTER — Encounter: Payer: BC Managed Care – PPO | Admitting: Gastroenterology

## 2017-09-20 ENCOUNTER — Encounter: Payer: Self-pay | Admitting: Gastroenterology

## 2017-10-04 ENCOUNTER — Encounter: Payer: Self-pay | Admitting: Gastroenterology

## 2017-10-04 ENCOUNTER — Other Ambulatory Visit: Payer: Self-pay

## 2017-10-04 ENCOUNTER — Ambulatory Visit (AMBULATORY_SURGERY_CENTER): Payer: BC Managed Care – PPO | Admitting: Gastroenterology

## 2017-10-04 VITALS — BP 105/66 | HR 73 | Temp 98.0°F | Resp 22 | Ht 65.5 in | Wt 220.0 lb

## 2017-10-04 DIAGNOSIS — D128 Benign neoplasm of rectum: Secondary | ICD-10-CM | POA: Diagnosis not present

## 2017-10-04 DIAGNOSIS — K649 Unspecified hemorrhoids: Secondary | ICD-10-CM | POA: Diagnosis not present

## 2017-10-04 DIAGNOSIS — Z1211 Encounter for screening for malignant neoplasm of colon: Secondary | ICD-10-CM | POA: Diagnosis present

## 2017-10-04 DIAGNOSIS — D124 Benign neoplasm of descending colon: Secondary | ICD-10-CM | POA: Diagnosis not present

## 2017-10-04 MED ORDER — SODIUM CHLORIDE 0.9 % IV SOLN: 500.0000 mL | Freq: Once | INTRAVENOUS | Status: AC

## 2017-10-04 NOTE — Progress Notes (Signed)
Pt's states no medical or surgical changes since previsit or office visit. 

## 2017-10-04 NOTE — Progress Notes (Signed)
Called to room to assist during endoscopic procedure.  Patient ID and intended procedure confirmed with present staff. Received instructions for my participation in the procedure from the performing physician.  

## 2017-10-04 NOTE — Patient Instructions (Signed)
YOU HAD AN ENDOSCOPIC PROCEDURE TODAY AT [facility]:   Refer to the procedure report that was given to you for any specific questions about what was found during the examination.  If the procedure report does not answer your questions, please call your gastroenterologist to clarify.  If you requested that your care partner not be given the details of your procedure findings, then the procedure report has been included in a sealed envelope for you to review at your convenience later.  YOU SHOULD EXPECT: Some feelings of bloating in the abdomen. Passage of more gas than usual.  Walking can help get rid of the air that was put into your GI tract during the procedure and reduce the bloating. If you had a lower endoscopy (such as a colonoscopy or flexible sigmoidoscopy) you may notice spotting of blood in your stool or on the toilet paper. If you underwent a bowel prep for your procedure, you may not have a normal bowel movement for a few days.  Please Note:  You might notice some irritation and congestion in your nose or some drainage.  This is from the oxygen used during your procedure.  There is no need for concern and it should clear up in a day or so.  SYMPTOMS TO REPORT IMMEDIATELY:   Following lower endoscopy (colonoscopy or flexible sigmoidoscopy):  Excessive amounts of blood in the stool  Significant tenderness or worsening of abdominal pains  Swelling of the abdomen that is new, acute  Fever of 100F or higher  Please see handouts given to you on polyps and Hemorrhoids.  For urgent or emergent issues, a gastroenterologist can be reached at any hour by calling [phone].   DIET:  We do recommend a small meal at first, but then you may proceed to your regular diet.  Drink plenty of fluids but you should avoid alcoholic beverages for 24 hours.  ACTIVITY:  You should plan to take it easy for the rest of today and you should NOT DRIVE or use heavy machinery until tomorrow  (because of the sedation medicines used during the test).    FOLLOW UP: Our staff will call the number listed on your records the next business day following your procedure to check on you and address any questions or concerns that you may have regarding the information given to you following your procedure. If we do not reach you, we will leave a message.  However, if you are feeling well and you are not experiencing any problems, there is no need to return our call.  We will assume that you have returned to your regular daily activities without incident.  If any biopsies were taken you will be contacted by phone or by letter within the next 1-3 weeks.  Please call us at [phone] if you have not heard about the biopsies in 3 weeks.    SIGNATURES/CONFIDENTIALITY: You and/or your care partner have signed paperwork which will be entered into your electronic medical record.  These signatures attest to the fact that that the information above on your After Visit Summary has been reviewed and is understood.  Full responsibility of the confidentiality of this discharge information lies with you and/or your care-partner.  Thank you for letting us take care of your healthcare needs today.

## 2017-10-04 NOTE — Progress Notes (Signed)
Report given to PACU, vss 

## 2017-10-04 NOTE — Op Note (Signed)
[facility] Patient Name: Tara Oliver Procedure Date: 10/04/2017 7:54 AM MRN: [id number] Endoscopist: Milus Banister , MD Age: 52 Referring MD:  Date of Birth: 09-16-1965 Gender: Female Account #: [id number] Procedure:                Colonoscopy Indications:              Screening for colorectal malignant neoplasm Medicines:                Monitored Anesthesia Care Procedure:                Pre-Anesthesia Assessment:                           - Prior to the procedure, a History and Physical                            was performed, and patient medications and                            allergies were reviewed. The patient's tolerance of                            previous anesthesia was also reviewed. The risks                            and benefits of the procedure and the sedation                            options and risks were discussed with the patient.                            All questions were answered, and informed consent                            was obtained. Prior Anticoagulants: The patient has                            taken no previous anticoagulant or antiplatelet                            agents. ASA Grade Assessment: II - A patient with                            mild systemic disease. After reviewing the risks                            and benefits, the patient was deemed in                            satisfactory condition to undergo the procedure.                           After obtaining informed consent, the colonoscope  was passed under direct vision. Throughout the                            procedure, the patient's blood pressure, pulse, and                            oxygen saturations were monitored continuously. The                            Colonoscope was introduced through the anus and                            advanced to the the cecum, identified by                            appendiceal orifice and  ileocecal valve. The                            colonoscopy was performed without difficulty. The                            patient tolerated the procedure well. The quality                            of the bowel preparation was excellent. Scope In: 8:08:47 AM Scope Out: 8:23:26 AM Scope Withdrawal Time: 0 hours 12 minutes 17 seconds  Total Procedure Duration: 0 hours 14 minutes 39 seconds  Findings:                 Two sessile polyps were found in the descending                            colon. The polyps were 2 to 3 mm in size. These                            polyps were removed with a cold snare. Resection                            and retrieval were complete.                           A 9 mm polyp (slightly irregular appearing, heaped                            up) was found in the very distal, anterior rectum                            (adjacent to internal hemorrhoids). The polyp was                            semi-pedunculated. The polyp was removed with a hot                            snare.  Resection and retrieval were complete.                           The exam was otherwise without abnormality on                            direct and retroflexion views. Complications:            No immediate complications. Estimated blood loss:                            None. Estimated Blood Loss:     Estimated blood loss: none. Impression:               - Two 2 to 3 mm polyps in the descending colon,                            removed with a cold snare. Resected and retrieved.                           - One 9 mm polyp in the rectum, removed with a hot                            snare. Resected and retrieved.                           - Small internal hemorrhoids.                           - The examination was otherwise normal on direct                            and retroflexion views. Recommendation:           - Patient has a contact number available for                             emergencies. The signs and symptoms of potential                            delayed complications were discussed with the                            patient. Return to normal activities tomorrow.                            Written discharge instructions were provided to the                            patient.                           - Resume previous diet.                           - Continue present medications.  You will receive a letter within 2-3 weeks with the                            pathology results and my final recommendations.                           If the polyp(s) is proven to be 'pre-cancerous' on                            pathology, you will need repeat colonoscopy in 3-5                            years. If the polyp(s) is NOT 'precancerous' on                            pathology then you should repeat colon cancer                            screening in 10 years with colonoscopy without need                            for colon cancer screening by any method prior to                            then (including stool testing). Milus Banister, MD 10/04/2017 8:35:09 AM This report has been signed electronically.

## 2017-10-05 ENCOUNTER — Telehealth: Payer: Self-pay

## 2017-10-05 NOTE — Telephone Encounter (Signed)
  Follow up Call-  Call back number 10/04/2017  Post procedure Call Back phone  # [phone]  Permission to leave phone message Yes  Some recent data might be hidden     Patient questions:  Do you have a fever, pain , or abdominal swelling? No. Pain Score  0 *  Have you tolerated food without any problems? Yes.    Have you been able to return to your normal activities? Yes.    Do you have any questions about your discharge instructions: Diet   No. Medications  No. Follow up visit  No.  Do you have questions or concerns about your Care? No.  Actions: * If pain score is 4 or above: No action needed, pain <4.

## 2017-10-11 ENCOUNTER — Encounter: Payer: Self-pay | Admitting: Gastroenterology

## 2019-08-13 ENCOUNTER — Ambulatory Visit: Payer: BC Managed Care – PPO | Attending: Internal Medicine

## 2019-08-13 DIAGNOSIS — Z23 Encounter for immunization: Secondary | ICD-10-CM | POA: Insufficient documentation

## 2019-08-13 NOTE — Progress Notes (Signed)
   Covid-19 Vaccination Clinic  Name:  CAMRYNN MACBRIDE    MRN: [id number] DOB: Aug 06, 1965  08/13/2019  Ms. Debow was observed post Covid-19 immunization for 15 minutes without incident. She was provided with Vaccine Information Sheet and instruction to access the V-Safe system.   Ms. Kubilus was instructed to call 911 with any severe reactions post vaccine: Marland Kitchen Difficulty breathing  . Swelling of face and throat  . A fast heartbeat  . A bad rash all over body  . Dizziness and weakness   Immunizations Administered    Name Date Dose VIS Date Route   Pfizer COVID-19 Vaccine 08/13/2019 12:15 PM 0.3 mL 05/18/2019 Intramuscular   Manufacturer: [place]   Lot: [id number]   [place]: [id number]

## 2019-09-12 ENCOUNTER — Ambulatory Visit: Payer: BC Managed Care – PPO | Attending: Internal Medicine

## 2019-09-12 DIAGNOSIS — Z23 Encounter for immunization: Secondary | ICD-10-CM

## 2019-09-12 NOTE — Progress Notes (Signed)
   Covid-19 Vaccination Clinic  Name:  Tara Oliver    MRN: [id number] DOB: [birth year]  09/12/2019  [name] was observed post Covid-19 immunization for 15 minutes without incident. She was provided with Vaccine Information Sheet and instruction to access the V-Safe system.   [name] was instructed to call 911 with any severe reactions post vaccine: Marland Kitchen Difficulty breathing  . Swelling of face and throat  . A fast heartbeat  . A bad rash all over body  . Dizziness and weakness   Immunizations Administered    Name Date Dose VIS Date Route   Pfizer COVID-19 Vaccine 09/12/2019 11:55 AM 0.3 mL 05/18/2019 Intramuscular   Manufacturer: [place]   Lot: [id number]   [place]: [id number]

## 2020-06-30 ENCOUNTER — Ambulatory Visit: Payer: BC Managed Care – PPO | Admitting: Internal Medicine

## 2020-06-30 ENCOUNTER — Other Ambulatory Visit: Payer: Self-pay

## 2020-06-30 ENCOUNTER — Encounter: Payer: Self-pay | Admitting: Internal Medicine

## 2020-06-30 VITALS — BP 120/84 | HR 102 | Ht 65.5 in | Wt 217.4 lb

## 2020-06-30 DIAGNOSIS — I209 Angina pectoris, unspecified: Secondary | ICD-10-CM | POA: Diagnosis not present

## 2020-06-30 DIAGNOSIS — R9431 Abnormal electrocardiogram [ECG] [EKG]: Secondary | ICD-10-CM

## 2020-06-30 MED ORDER — METOPROLOL TARTRATE 100 MG PO TABS: ORAL_TABLET | ORAL | 0 refills | Status: DC

## 2020-06-30 MED ORDER — ATORVASTATIN CALCIUM 20 MG PO TABS: 20.0000 mg | ORAL_TABLET | Freq: Every day | ORAL | 3 refills | Status: DC

## 2020-06-30 NOTE — Patient Instructions (Signed)
Medication Instructions:  Your physician has recommended you make the following change in your medication:  1.) increase atorvastatin to 20 mg daily  *If you need a refill on your cardiac medications before your next appointment, please call your pharmacy*   Lab Work: none   Testing/Procedures: Your physician has requested that you have an echocardiogram. Echocardiography is a painless test that uses sound waves to create images of your heart. It provides your doctor with information about the size and shape of your heart and how well your heart's chambers and valves are working. This procedure takes approximately one hour. There are no restrictions for this procedure.   Follow-Up: Follow up with your physician will depend on test results.   Other Instructions Your cardiac CT will be scheduled at one of the below locations:   [facility] [address] [place], [place] [address] [phone]  [facility] [address] [place], [place] [address] [phone]  If scheduled at [facility], please arrive at the [facility] main entrance of [facility] 30 minutes prior to test start time. Proceed to the [facility] Radiology Department (first floor) to check-in and test prep.  If scheduled at [facility], please arrive 15 mins early for check-in and test prep.  Please follow these instructions carefully (unless otherwise directed):   On the Night Before the Test: . Be sure to Drink plenty of water. . Do not consume any caffeinated/decaffeinated beverages or chocolate 12 hours prior to your test. Do not take any antihistamines 12 hours prior to your test.  On the Day of the Test: . Drink plenty of water. Do not drink any water within one hour of the test. . Do not eat any food 4 hours prior to the test. . You may take your regular medications prior to the test.  . Take  metoprolol (Lopressor) two hours prior to test. . FEMALES- please wear underwire-free bra if available       After the Test: . Drink plenty of water. . After receiving IV contrast, you may experience a mild flushed feeling. This is normal. . On occasion, you may experience a mild rash up to 24 hours after the test. This is not dangerous. If this occurs, you can take Benadryl 25 mg and increase your fluid intake. . If you experience trouble breathing, this can be serious. If it is severe call 911 IMMEDIATELY. If it is mild, please call our office. . If you take any of these medications: Glipizide/Metformin, Avandament, Glucavance, please do not take 48 hours after completing test unless otherwise instructed.   Once we have confirmed authorization from your insurance company, we will call you to set up a date and time for your test. Based on how quickly your insurance processes prior authorizations requests, please allow up to 4 weeks to be contacted for scheduling your Cardiac CT appointment. Be advised that routine Cardiac CT appointments could be scheduled as many as 8 weeks after your provider has ordered it.  For non-scheduling related questions, please contact the cardiac imaging nurse navigator should you have any questions/concerns: [name], Cardiac Imaging Nurse Navigator Burley Saver, Interim Cardiac Imaging Nurse [place] [facility] Direct Office Dial: [phone]   For scheduling needs, including cancellations and rescheduling, please call [place], [phone].

## 2020-06-30 NOTE — Progress Notes (Signed)
Cardiology Office Note   Date:  06/30/2020   ID:  Tara Oliver, DOB Dec 28, 1965, MRN [id number]  PCP:  Antony Contras, MD  Cardiologist:   Dorris Carnes, MD   Pt referred for abnormal EKG     History of Present Illness: Tara Oliver is a 55 y.o. 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      Pt complains of pain in her L back   Ache  CHronic   SHe thinks related to stress.    Husband had stroke at 22  Trying to eat better    Current Meds  Medication Sig  . aspirin 81 MG chewable tablet Chew 81 mg by mouth daily.  Marland Kitchen atorvastatin (LIPITOR) 10 MG tablet TK 1 T PO QD  . Dulaglutide (TRULICITY) 2.83 TD/1.7OH SOPN Inject 1.5 mg as directed once a week.  Marland Kitchen FARXIGA 10 MG TABS tablet   . fluconazole (DIFLUCAN) 100 MG tablet Take 100 mg by mouth once a week.  . loratadine (CLARITIN) 10 MG tablet Take 10 mg by mouth daily.  . metFORMIN (GLUMETZA) 500 MG (MOD) 24 hr tablet Take 1,000 mg by mouth 2 (two) times daily with a meal.  . Multiple Vitamin (MULTIVITAMIN ADULT) TABS Take 1 tablet by mouth daily.   Current Facility-Administered Medications for the 06/30/20 encounter (Office Visit) with Fay Records, MD  Medication  . 0.9 %  sodium chloride infusion     Allergies:   Azithromycin   Past Medical History:  Diagnosis Date  . Allergy   . Anemia   . Diabetes mellitus   . Hyperlipidemia     Past Surgical History:  Procedure Laterality Date  . CESAREAN SECTION     x3  . RETINAL DETACHMENT SURGERY       Social History:  The patient  reports that she has never smoked. She has never used smokeless tobacco. She reports current alcohol use. She reports that she does not use drugs.   Family History:  The patient's family history includes  Colon cancer in an other family member; Stomach cancer in her maternal aunt.   Father had MI at 5   CVA (hemorrhagic at 4)   ROS:  Please see the history of present illness. All other systems are reviewed and  Negative to the above problem except as noted.    PHYSICAL EXAM: VS:  BP 120/84   Pulse (!) 102   Ht 5' 5.5" (1.664 m)   Wt 217 lb 6.4 oz (98.6 kg)   BMI 35.63 kg/m   GEN:Pt an obese 55 yo in no acute distress  HEENT: normal  Neck: no JVD, carotid bruits, Cardiac: RRR; no murmurs no LE edema  Respiratory:  clear to auscultation bilaterally,  GI: soft, nontender, nondistended, + BS  No hepatomegaly  MS: no deformity Moving all extremities   Skin: warm and dry, no rash Neuro:  Strength and sensation are intact Psych: euthymic mood, full affect   EKG:  EKG is ordered today. ST 102  Inferior MI  Anterolateral MI     Lipid Panel No results found for: CHOL, TRIG, HDL, CHOLHDL, VLDL,  [place], LDLDIRECT    Wt Readings from Last 3 Encounters:  06/30/20 217 lb 6.4 oz (98.6 kg)  10/04/17 220 lb (99.8 kg)  08/30/17 220 lb (99.8 kg)      ASSESSMENT AND PLAN:  1  Abnromal EKG  EKG is abnormal  Different thatn reported ekg from August 2021.   Now anterolateral changes.     She denies CP    I do not think her bak pain is angina I would recomm an echo to eval LV function    If normal would, given changes in EKG and risks for CAD would proceed with CT coronary angiogram   WOUld recomm ASA81 mg   2  HL    Would keep on statin.    3  DM   Discussed diet (low carb and time restricted eating   4  Obesity  Discussed diet as noted   F?U based on test results      Current medicines are reviewed at length with the patient today.  The patient does not have concerns regarding medicines.  Signed, Dorris Carnes, MD  06/30/2020 9:37 AM    [place], [place],   [address] Phone: [phone]; Fax: [phone]

## 2020-07-03 ENCOUNTER — Other Ambulatory Visit: Payer: Self-pay

## 2020-07-03 ENCOUNTER — Ambulatory Visit (HOSPITAL_COMMUNITY): Payer: BC Managed Care – PPO | Attending: Internal Medicine

## 2020-07-03 DIAGNOSIS — I209 Angina pectoris, unspecified: Secondary | ICD-10-CM

## 2020-07-03 DIAGNOSIS — R9431 Abnormal electrocardiogram [ECG] [EKG]: Secondary | ICD-10-CM | POA: Diagnosis present

## 2020-07-03 LAB — ECHOCARDIOGRAM COMPLETE
Area-P 1/2: 4.15 cm2
S' Lateral: 1.5 cm

## 2020-07-03 MED ORDER — PERFLUTREN LIPID MICROSPHERE
1.0000 mL | INTRAVENOUS | Status: AC | PRN
  Administered 2020-07-03: 3 mL via INTRAVENOUS

## 2020-07-09 ENCOUNTER — Other Ambulatory Visit: Payer: Self-pay | Admitting: *Deleted

## 2020-07-09 DIAGNOSIS — Z01812 Encounter for preprocedural laboratory examination: Secondary | ICD-10-CM

## 2020-07-14 ENCOUNTER — Other Ambulatory Visit: Payer: Self-pay

## 2020-07-14 ENCOUNTER — Other Ambulatory Visit: Payer: BC Managed Care – PPO | Admitting: *Deleted

## 2020-07-14 DIAGNOSIS — Z01812 Encounter for preprocedural laboratory examination: Secondary | ICD-10-CM

## 2020-07-14 LAB — BASIC METABOLIC PANEL
BUN/Creatinine Ratio: 13 (ref 9–23)
BUN: 10 mg/dL (ref 6–24)
CO2: 21 mmol/L (ref 20–29)
Calcium: 9.4 mg/dL (ref 8.7–10.2)
Chloride: 103 mmol/L (ref 96–106)
Creatinine, Ser: 0.75 mg/dL (ref 0.57–1.00)
GFR calc Af Amer: 105 mL/min/{1.73_m2} (ref >59)
GFR calc non Af Amer: 91 mL/min/{1.73_m2} (ref >59)
Glucose: 151 mg/dL — ABNORMAL HIGH (ref 65–99)
Potassium: 4.2 mmol/L (ref 3.5–5.2)
Sodium: 140 mmol/L (ref 134–144)

## 2020-07-16 ENCOUNTER — Telehealth (HOSPITAL_COMMUNITY): Payer: Self-pay | Admitting: Emergency Medicine

## 2020-07-16 NOTE — Telephone Encounter (Signed)
Reaching out to patient to offer assistance regarding upcoming cardiac imaging study; pt verbalizes understanding of appt date/time, parking situation and where to check in, pre-test NPO status and medications ordered, and verified current allergies; name and call back number provided for further questions should they arise [name] RN Navigator [facility] [phone] office [phone] cell  Pt to take 100mg  metoprolol 2 hr prior to scan Holding claritin, Pt reports some clasutro  Tara Oliver

## 2020-07-17 ENCOUNTER — Other Ambulatory Visit: Payer: Self-pay

## 2020-07-17 ENCOUNTER — Ambulatory Visit (HOSPITAL_COMMUNITY)
Admission: RE | Admit: 2020-07-17 | Discharge: 2020-07-17 | Disposition: A | Discharge status: Home / Self Care | Payer: BC Managed Care – PPO | Source: Ambulatory Visit | Attending: Internal Medicine | Admitting: Internal Medicine

## 2020-07-17 DIAGNOSIS — R9431 Abnormal electrocardiogram [ECG] [EKG]: Secondary | ICD-10-CM | POA: Insufficient documentation

## 2020-07-17 DIAGNOSIS — I209 Angina pectoris, unspecified: Secondary | ICD-10-CM

## 2020-07-17 MED ORDER — IOHEXOL 350 MG/ML SOLN
80.0000 mL | Freq: Once | INTRAVENOUS | Status: AC | PRN
  Administered 2020-07-17: 80 mL via INTRAVENOUS

## 2020-07-17 MED ORDER — NITROGLYCERIN 0.4 MG SL SUBL
0.8000 mg | SUBLINGUAL_TABLET | Freq: Once | SUBLINGUAL | Status: AC
  Administered 2020-07-17: 0.8 mg via SUBLINGUAL

## 2020-07-17 MED ORDER — NITROGLYCERIN 0.4 MG SL SUBL
SUBLINGUAL_TABLET | SUBLINGUAL | Status: AC
  Filled 2020-07-17: qty 2

## 2020-07-17 MED ORDER — METOPROLOL TARTRATE 5 MG/5ML IV SOLN
INTRAVENOUS | Status: AC
  Filled 2020-07-17: qty 10

## 2020-07-17 MED ORDER — METOPROLOL TARTRATE 5 MG/5ML IV SOLN
5.0000 mg | INTRAVENOUS | Status: DC | PRN
  Administered 2020-07-17: 5 mg via INTRAVENOUS

## 2020-07-17 NOTE — Progress Notes (Signed)
CT scan completed. Tolerated well. D/C home ambulatory with husband. Awake and alert. In no distress. 

## 2020-07-24 ENCOUNTER — Other Ambulatory Visit: Payer: Self-pay | Admitting: *Deleted

## 2020-08-05 ENCOUNTER — Telehealth: Payer: Self-pay | Admitting: Internal Medicine

## 2020-08-05 NOTE — Telephone Encounter (Signed)
Pt wrote in about what to do  Keep on same meds    Goal:   Keep sugars in control Keep on statin  Re hiatal hernia   If reflux symptoms discuss with Dr Moreen Fowler  I would f/u in 1 year

## 2020-08-06 NOTE — Telephone Encounter (Signed)
Replied to pt's message with Dr. Alan Ripper recommendations.

## 2021-09-23 IMAGING — CT CT HEART MORP W/ CTA COR W/ SCORE W/ CA W/CM &/OR W/O CM
4 of 7 series · 8 of 20 positions shown, 9 images · IV contrast (APPLIED)
Comparison: None.
COMPARISON: None.

Addendum:
EXAM:
OVER-READ INTERPRETATION  CT CHEST

The following report is an over-read performed by radiologist Dr.
Maheer Niaz [REDACTED] on 07/17/2020. This
over-read does not include interpretation of cardiac or coronary
anatomy or pathology. The coronary CTA interpretation by the
cardiologist is attached.
TECHNIQUE: The patient was scanned on a [employer] Force scanner.

[Series 6: best diast 71 % · axial · 0.39mm/px · z∈[-162,-125]mm · 2 of 281 slices shown]
[im 94/281  vessel]
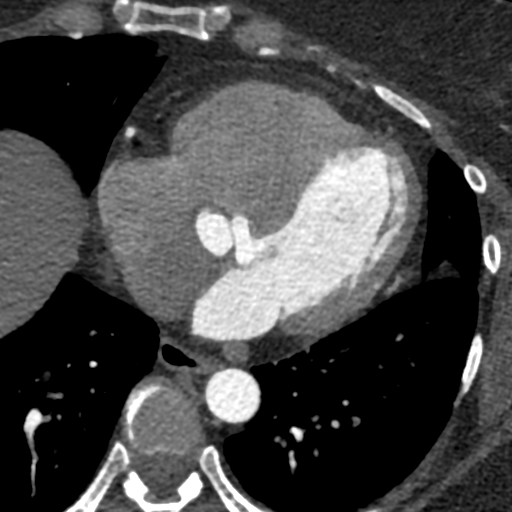
[im 187/281  vessel]
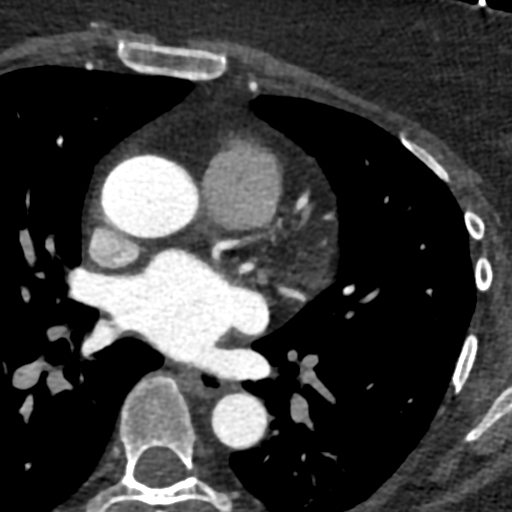

[Series 7: best syst · axial · 0.39mm/px · z∈[-162,-125]mm · 2 of 281 slices shown, 3 images]
[im 94/281  vessel]
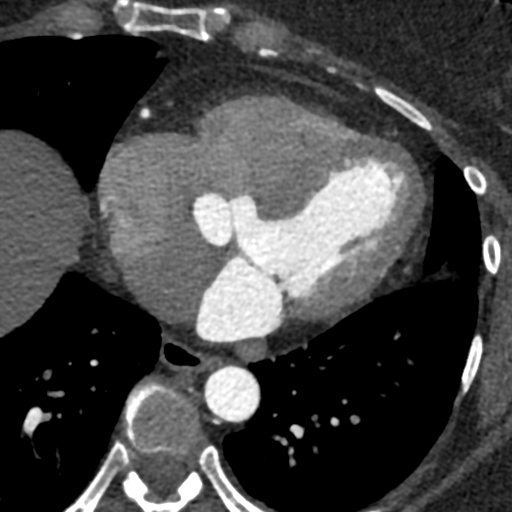
[im 94/281  lung]
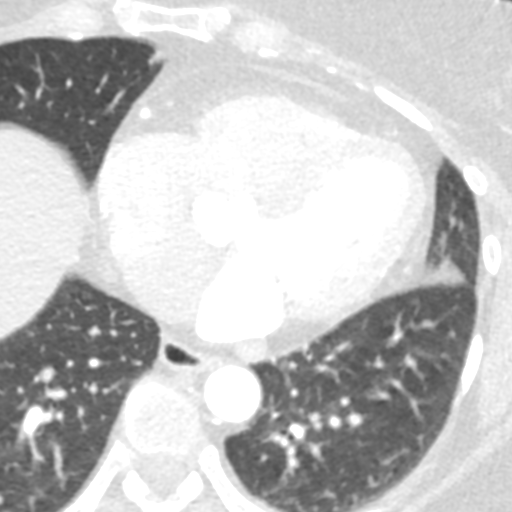
[im 187/281  vessel]
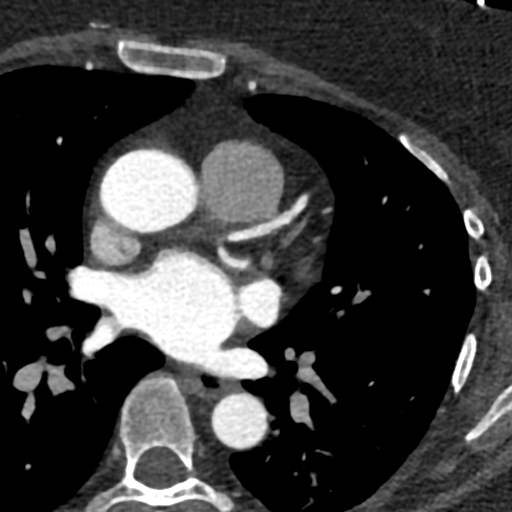

[Series 8: ts diast sharp 71 % · axial · 0.39mm/px · z∈[-162,-125]mm · 2 of 281 slices shown]
[im 94/281  lung]
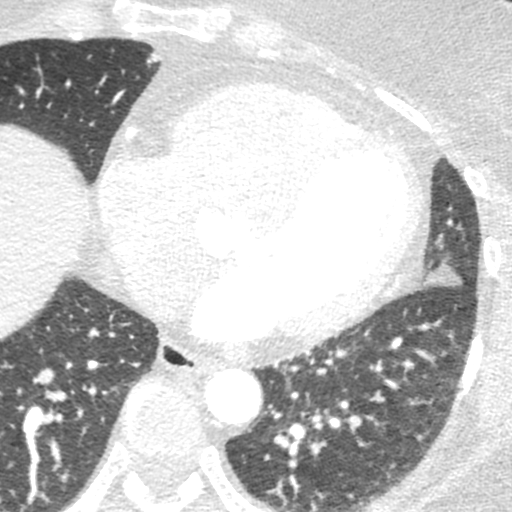
[im 187/281  lung]
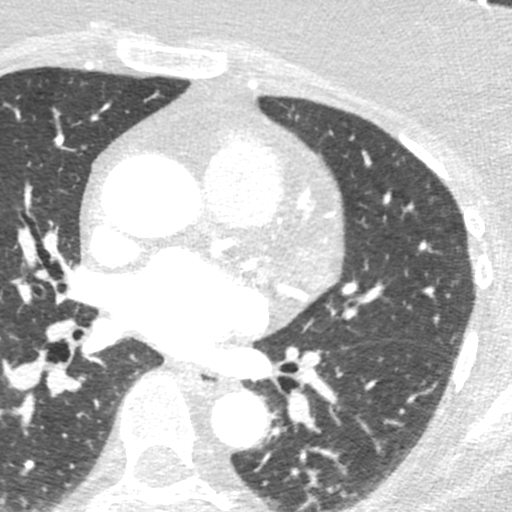

[Series 9: ts syst sharp 41 % · axial · 0.39mm/px · z∈[-162,-125]mm · 2 of 281 slices shown]
[im 94/281  lung]
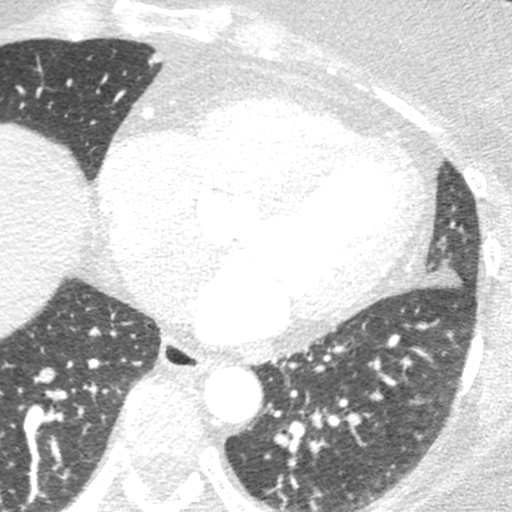
[im 187/281  lung]
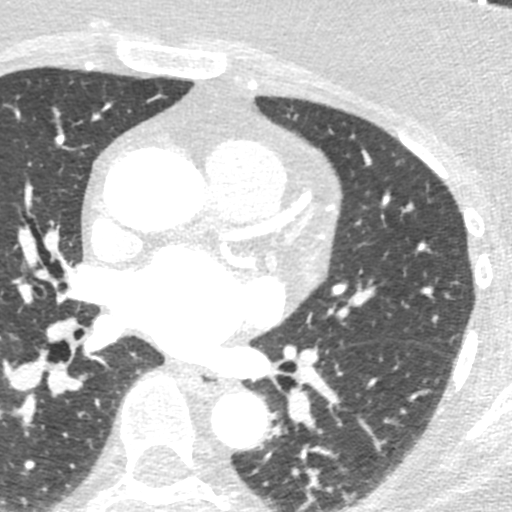

[8 of 20 positions shown; findings below may reference images not displayed]

FINDINGS: Vascular: The CT images show a somewhat bulbous appearance to the
upper aspect of the interventricular septum that extends into the
region of the left ventricular outflow tract. A component of left
ventricular outflow tract obstruction/subaortic stenosis cannot be
excluded by imaging.

Mediastinum/Nodes: Visualized mediastinum and hilar regions
demonstrate no lymphadenopathy or masses. There is a moderate-sized
hiatal hernia.

Lungs/Pleura: Visualized lungs show no evidence of pulmonary edema,
consolidation, pneumothorax, nodule or pleural fluid.

Upper Abdomen: No acute abnormality.

Musculoskeletal: No chest wall mass or suspicious bone lesions
identified.
IMPRESSION: 1. Somewhat bulbous appearance to the upper aspect of the
interventricular septum that extends into the region of the left
ventricular outflow tract. A component of left ventricular outflow
tract obstruction/subaortic stenosis cannot be excluded by imaging.
2. Moderate hiatal hernia.

EXAM:
Cardiac/Coronary  CT
FINDINGS: A 120 kV prospective scan was triggered in the descending thoracic
aorta at 111 HU's. Axial non-contrast 3 mm slices were carried out
through the heart. The data set was analyzed on a dedicated work
station and scored using the Agatson method. Gantry rotation speed
was 250 msecs and collimation was .6 mm. No beta blockade and 0.8 mg
of sl NTG was given. The 3D data set was reconstructed in 5%
intervals of the 67-82 % of the R-R cycle. Diastolic phases were
analyzed on a dedicated work station using MPR, MIP and VRT modes.
The patient received 80 cc of contrast.

Aorta:  Normal size.  No calcifications.  No dissection.

Aortic Valve:  Trileaflet.  No calcifications.

Coronary Arteries:  Normal coronary origin.  Right dominance.

RCA is a large dominant artery that gives rise to PDA and PLVB.
There is mild ectasia of the proximal RCA with no plaque.

Left main is a large artery that gives rise to LAD and LCX arteries.
There is no plaque.

LAD is a large vessel that gives rise to a large sized branching D1
and small D2 vessels. There is minimal calcified plaque in the mid
LAD at the takeoff of D1 with associated stenosis of 0-24%.

LCX is a non-dominant artery that gives rise to one large OM1
branch. There is no plaque.

Other findings:

Normal pulmonary vein drainage into the left atrium.

Normal let atrial appendage without a thrombus.

Normal size of the pulmonary artery.

Small patent foramen ovale.

Basal septal hypertrophy of the interventricular septum.
IMPRESSION: 1. Coronary calcium score of 2. This was 78th percentile for age and
sex matched control.

2.  Normal coronary origin with right dominance.

3.  Minimal CAD.  CAD RADS 1.

4.  Consider non atherosclerotic causes of chest pain.

5. Recommend risk factor factor modification and preventive therapy.

6.  Basal septal hypertrophy of the interventricular septum.

Gatito Kimbrell

*** End of Addendum ***
EXAM:
OVER-READ INTERPRETATION  CT CHEST

The following report is an over-read performed by radiologist Dr.
Maheer Niaz [REDACTED] on 07/17/2020. This
over-read does not include interpretation of cardiac or coronary
anatomy or pathology. The coronary CTA interpretation by the
cardiologist is attached.
FINDINGS: Vascular: The CT images show a somewhat bulbous appearance to the
upper aspect of the interventricular septum that extends into the
region of the left ventricular outflow tract. A component of left
ventricular outflow tract obstruction/subaortic stenosis cannot be
excluded by imaging.

Mediastinum/Nodes: Visualized mediastinum and hilar regions
demonstrate no lymphadenopathy or masses. There is a moderate-sized
hiatal hernia.

Lungs/Pleura: Visualized lungs show no evidence of pulmonary edema,
consolidation, pneumothorax, nodule or pleural fluid.

Upper Abdomen: No acute abnormality.

Musculoskeletal: No chest wall mass or suspicious bone lesions
identified.
IMPRESSION: 1. Somewhat bulbous appearance to the upper aspect of the
interventricular septum that extends into the region of the left
ventricular outflow tract. A component of left ventricular outflow
tract obstruction/subaortic stenosis cannot be excluded by imaging.
2. Moderate hiatal hernia.

## 2021-11-18 ENCOUNTER — Encounter (INDEPENDENT_AMBULATORY_CARE_PROVIDER_SITE_OTHER): Payer: Self-pay

## 2021-11-25 ENCOUNTER — Encounter (INDEPENDENT_AMBULATORY_CARE_PROVIDER_SITE_OTHER): Payer: Self-pay | Admitting: Ophthalmology

## 2021-11-25 ENCOUNTER — Ambulatory Visit (INDEPENDENT_AMBULATORY_CARE_PROVIDER_SITE_OTHER): Payer: BC Managed Care – PPO | Admitting: Ophthalmology

## 2021-11-25 DIAGNOSIS — H2513 Age-related nuclear cataract, bilateral: Secondary | ICD-10-CM

## 2021-11-25 DIAGNOSIS — Z8669 Personal history of other diseases of the nervous system and sense organs: Secondary | ICD-10-CM | POA: Diagnosis not present

## 2021-11-25 DIAGNOSIS — E119 Type 2 diabetes mellitus without complications: Secondary | ICD-10-CM | POA: Diagnosis not present

## 2021-11-25 DIAGNOSIS — H43823 Vitreomacular adhesion, bilateral: Secondary | ICD-10-CM

## 2021-11-25 DIAGNOSIS — T85310A Breakdown (mechanical) of prosthetic orbit of right eye, initial encounter: Secondary | ICD-10-CM

## 2021-11-25 DIAGNOSIS — T85398A Other mechanical complication of other ocular prosthetic devices, implants and grafts, initial encounter: Secondary | ICD-10-CM | POA: Insufficient documentation

## 2021-11-25 NOTE — Assessment & Plan Note (Signed)
No detectable diabetic retinopathy 

## 2021-11-25 NOTE — Assessment & Plan Note (Signed)
Minor, no impact on acuity at this time follow-up Dr. Lucianne Lei

## 2021-11-25 NOTE — Progress Notes (Signed)
11/25/2021     CHIEF COMPLAINT Patient presents for  Chief Complaint  Patient presents with   Retina Evaluation      HISTORY OF PRESENT ILLNESS: Tara Oliver is a 56 y.o. female who presents to the clinic today for:   HPI   NP- Questioning Thinning of Sclera around the SB, referred by Dr. Lucianne Lei. Patient was seen by Dr Lucianne Lei for her annual eye exam on 11/18/2021.  "I don't notice anything that has changed. In my right eye I have lost peripheral vision in my upper area of my vision." Denies new FOL or floaters.  Last edited by Laurin Coder on 11/25/2021  3:07 PM.      Referring physician: Lisabeth Pick, MD [place],  [place] [address]  HISTORICAL INFORMATION:   Selected notes from the MEDICAL RECORD NUMBER       CURRENT MEDICATIONS: No current outpatient medications on file. (Ophthalmic Drugs)   No current facility-administered medications for this visit. (Ophthalmic Drugs)   Current Outpatient Medications (Other)  Medication Sig   atorvastatin (LIPITOR) 20 MG tablet Take 1 tablet (20 mg total) by mouth daily.   Dulaglutide (TRULICITY) 0.07 MA/2.6JF SOPN Inject 1.5 mg as directed once a week.   FARXIGA 10 MG TABS tablet    fluconazole (DIFLUCAN) 100 MG tablet Take 100 mg by mouth once a week.   loratadine (CLARITIN) 10 MG tablet Take 10 mg by mouth daily.   metFORMIN (GLUMETZA) 500 MG (MOD) 24 hr tablet Take 1,000 mg by mouth 2 (two) times daily with a meal.   metoprolol tartrate (LOPRESSOR) 100 MG tablet Take one tablet by mouth 2 hours before ct   Multiple Vitamin (MULTIVITAMIN ADULT) TABS Take 1 tablet by mouth daily.   ONETOUCH VERIO test strip USE TO TEST BLOOD GLUCOSE ONCE DAILY AS DIRECTED (Patient not taking: Reported on 06/30/2020)   Current Facility-Administered Medications (Other)  Medication Route   0.9 %  sodium chloride infusion Intravenous      REVIEW OF SYSTEMS: ROS   Negative for: Constitutional, Gastrointestinal, Neurological,  Skin, Genitourinary, Musculoskeletal, HENT, Endocrine, Cardiovascular, Eyes, Respiratory, Psychiatric, Allergic/Imm, Heme/Lymph Last edited by Hurman Horn, MD on 11/25/2021  4:24 PM.       ALLERGIES Allergies  Allergen Reactions   Azithromycin Other (See Comments)    Stomach cramps/ pains     PAST MEDICAL HISTORY Past Medical History:  Diagnosis Date   Allergy    Anemia    Diabetes mellitus    Hyperlipidemia    Past Surgical History:  Procedure Laterality Date   CESAREAN SECTION     x3   RETINAL DETACHMENT SURGERY      FAMILY HISTORY Family History  Problem Relation Age of Onset   Stomach cancer Maternal Aunt    Colon cancer Other    Colon polyps Neg Hx    Esophageal cancer Neg Hx    Rectal cancer Neg Hx     SOCIAL HISTORY Social History   Tobacco Use   Smoking status: Never   Smokeless tobacco: Never  Vaping Use   Vaping Use: Never used  Substance Use Topics   Alcohol use: Yes    Comment: rare    Drug use: Never         OPHTHALMIC EXAM:  Base Eye Exam     Visual Acuity (ETDRS)       Right Left   Dist cc 20/30 +2 20/20 -2   Dist ph cc 20/20  Correction: Contacts         Tonometry (Tonopen, 3:12 PM)       Right Left   Pressure 13 18         Pupils       Pupils Dark Light APD   Right PERRL 4 4 None   Left PERRL 4 4 None         Visual Fields (Counting fingers)       Left Right    Full Full         Extraocular Movement       Right Left    Full Full         Neuro/Psych     Oriented x3: Yes   Mood/Affect: Normal         Dilation     Both eyes: 1.0% Mydriacyl, 2.5% Phenylephrine @ 3:12 PM           Slit Lamp and Fundus Exam     Slit Lamp Exam       Right Left   Lids/Lashes Normal Normal   Conjunctiva/Sclera Inferonasal conjunctiva, well covered but visible multiple element and suture, not exposed, no debris No extrusion of buccal   Cornea Clear Clear   Anterior Chamber Deep and quiet Deep  and quiet   Iris Round and reactive Round and reactive   Lens 2+ Nuclear sclerosis 2+ Nuclear sclerosis   Anterior Vitreous Normal Normal         Fundus Exam       Right Left   C/D Ratio 0.35 0.25   Vessels no DR no DR   Periphery Good scleral buckle nearly 360, small suture intrusion at 6 meridian inferiorly, retina attached observe Good buckle good cryo retina attached            IMAGING AND PROCEDURES  Imaging and Procedures for 11/25/21  OCT, Retina - OU - Both Eyes       Right Eye Quality was good. Scan locations included subfoveal. Central Foveal Thickness: 294. Progression has no prior data. Findings include vitreomacular adhesion .   Left Eye Quality was good. Scan locations included subfoveal. Central Foveal Thickness: 298. Progression has no prior data. Findings include vitreomacular adhesion .   Notes OS, what appears to be an old RPE detachment temporally.  No active disease.  Observe  Bilateral history of RD yet vitreomacular adhesion posteriorly visible with no topographic distortion to the macula     Color Fundus Photography Optos - OU - Both Eyes       Right Eye Disc findings include normal observations. Macula : normal observations. Vessels : normal observations.   Left Eye Progression has no prior data. Disc findings include normal observations. Macula : normal observations. Vessels : normal observations.   Notes Inferior scleral buckle, band apparent as well, retina attached media clear good cryo, OD.  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  OD now with minor early extrusion of scleral buckle, inferonasal OD yet no intervention required is excellent conjunctival coverage,  Diabetes mellitus without  complication (HCC) No detectable diabetic retinopathy  Nuclear sclerotic cataract of both eyes Minor, no impact on acuity at this time follow-up Dr. Lucianne Lei     ICD-10-CM   1. History  of retinal detachment  Z86.69 OCT, Retina - OU - Both Eyes    Color Fundus Photography Optos - OU - Both Eyes    2. Vitreomacular adhesion of both eyes  H43.823 OCT, Retina - OU - Both Eyes    Color Fundus Photography Optos - OU - Both Eyes    3. 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 Diabetes mellitus without complication ([place])  U76.5     5. Nuclear sclerotic cataract of both eyes  H25.13       1.  OU with history of retinal detachment looks great.  2.  Partial extrusion scleral buckle inferiorly OD, inferonasal, yet with no impact on acuity no impact on retina and no impact on conjunctiva with no dehiscence of the overlying conjunctiva recommend observe.  3.  Follow-up Dr. Lucianne Lei as scheduled that here once yearly  Ophthalmic Meds Ordered this visit:  No orders of the defined types were placed in this encounter.      Return in about 1 year (around 11/26/2022) for DILATE OU, COLOR FP, OCT.  There are no Patient Instructions on file for this visit.   Explained the diagnoses, plan, and follow up with the patient and they expressed understanding.  Patient expressed understanding of the importance of proper follow up care.   Clent Demark Ashya Nicolaisen M.D. Diseases & Surgery of the Retina and Vitreous Retina & Diabetic [place] 11/25/21     Abbreviations: M myopia (nearsighted); A astigmatism; H hyperopia (farsighted); P presbyopia; Mrx spectacle prescription;  CTL contact lenses; OD right eye; OS left eye; OU both eyes  XT exotropia; ET esotropia; PEK punctate epithelial keratitis; PEE punctate epithelial erosions; DES dry eye syndrome; MGD meibomian gland dysfunction; ATs artificial tears; PFAT's preservative free artificial tears; [place] nuclear sclerotic  cataract; PSC posterior subcapsular cataract; ERM epi-retinal membrane; PVD posterior vitreous detachment; RD retinal detachment; DM diabetes mellitus; DR diabetic retinopathy; NPDR non-proliferative diabetic retinopathy; PDR proliferative diabetic retinopathy; CSME clinically significant macular edema; DME diabetic macular edema; dbh dot blot hemorrhages; CWS cotton wool spot; POAG primary open angle glaucoma; C/D cup-to-disc ratio; HVF humphrey visual field; GVF goldmann visual field; OCT optical coherence tomography; IOP intraocular pressure; BRVO Branch retinal vein occlusion; CRVO central retinal vein occlusion; CRAO central retinal artery occlusion; BRAO branch retinal artery occlusion; RT retinal tear; SB scleral buckle; PPV pars plana vitrectomy; VH Vitreous hemorrhage; PRP panretinal laser photocoagulation; IVK intravitreal kenalog; VMT vitreomacular traction; MH Macular hole;  NVD neovascularization of the disc; NVE neovascularization elsewhere; AREDS age related eye disease study; ARMD age related macular degeneration; POAG primary open angle glaucoma; EBMD epithelial/anterior basement membrane dystrophy; ACIOL anterior chamber intraocular lens; IOL intraocular lens; PCIOL posterior chamber intraocular lens; Phaco/IOL phacoemulsification with intraocular lens placement; [place] photorefractive keratectomy; LASIK laser assisted in situ keratomileusis; HTN hypertension; DM diabetes mellitus; COPD chronic obstructive pulmonary disease

## 2021-11-25 NOTE — Assessment & Plan Note (Signed)
Retinal detachment repair some 25 years previous sequentially OD and OS.  OD now with minor early extrusion of scleral buckle, inferonasal OD yet no intervention required is excellent conjunctival coverage,

## 2022-02-10 ENCOUNTER — Ambulatory Visit: Payer: BC Managed Care – PPO | Attending: Internal Medicine | Admitting: Internal Medicine

## 2022-02-10 ENCOUNTER — Encounter: Payer: Self-pay | Admitting: Internal Medicine

## 2022-02-10 VITALS — BP 114/82 | HR 90 | Ht 65.5 in | Wt 215.4 lb

## 2022-02-10 DIAGNOSIS — E785 Hyperlipidemia, unspecified: Secondary | ICD-10-CM | POA: Diagnosis not present

## 2022-02-10 DIAGNOSIS — Z79899 Other long term (current) drug therapy: Secondary | ICD-10-CM | POA: Diagnosis not present

## 2022-02-10 NOTE — Patient Instructions (Signed)
Medication Instructions:   *If you need a refill on your cardiac medications before your next appointment, please call your pharmacy*   Lab Work: NMR, [place] PT  If you have labs (blood work) drawn today and your tests are completely normal, you will receive your results only by: MyChart Message (if you have MyChart) OR A paper copy in the mail If you have any lab test that is abnormal or we need to change your treatment, we will call you to review the results.   Testing/Procedures:    Follow-Up: At [facility], you and your health needs are our priority.  As part of our continuing mission to provide you with exceptional heart care, we have created designated Provider Care Teams.  These Care Teams include your primary Cardiologist (physician) and Advanced Practice Providers (APPs -  Physician Assistants and Nurse Practitioners) who all work together to provide you with the care you need, when you need it.  We recommend signing up for the patient portal called "MyChart".  Sign up information is provided on this After Visit Summary.  MyChart is used to connect with patients for Virtual Visits (Telemedicine).  Patients are able to view lab/test results, encounter notes, upcoming appointments, etc.  Non-urgent messages can be sent to your provider as well.   To learn more about what you can do with MyChart, go to [email or web address].    Your next appointment:   3 month(s)  The format for your next appointment:   In Person  Provider:   Dr Dorris Carnes    Other [place]

## 2022-02-10 NOTE — Progress Notes (Signed)
Cardiology Office Note   Date:  02/10/2022   ID:  Tara Oliver, DOB 03/31/1966, MRN [id number]  PCP:  Antony Contras, MD  Cardiologist:   Dorris Carnes, MD   Pt presents for follow up of HL   History of Present Illness: Tara Oliver is a 56 y.o. 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   Dulaglutide (TRULICITY) 9.44 HQ/7.5FF SOPN Inject 1.5 mg as directed once a week.   FARXIGA 10 MG TABS tablet    loratadine (CLARITIN) 10 MG tablet Take 10 mg by mouth daily.   metFORMIN (GLUMETZA) 500 MG (MOD) 24 hr tablet Take 1,000 mg by mouth 2 (two) times daily with a meal.   Multiple Vitamin (MULTIVITAMIN ADULT) TABS Take 1 tablet by mouth daily.   ONETOUCH VERIO test strip    Current Facility-Administered Medications for the 02/10/22 encounter (Office Visit) with Fay Records, MD  Medication   0.9 %  sodium chloride infusion     Allergies:   Azithromycin   Past Medical History:  Diagnosis Date   Allergy    Anemia    Diabetes mellitus    Hyperlipidemia     Past Surgical History:  Procedure Laterality Date   CESAREAN SECTION     x3   RETINAL DETACHMENT SURGERY       Social History:  The patient  reports that she has never smoked. She has never used smokeless tobacco. She reports current alcohol use. She reports that she does not use drugs.   Family History:  The patient's family history includes Colon cancer in an other family member; Stomach cancer in her maternal aunt.   Father had MI at 26   CVA (hemorrhagic at  52)   ROS:  Please see the history of present illness. All other systems are reviewed and  Negative to the above problem except as noted.    PHYSICAL EXAM: VS:  BP 114/82   Pulse 90   Ht 5' 5.5" (1.664 m)   Wt 215 lb 6.4 oz (97.7 kg)   SpO2 95%   BMI 35.30 kg/m   GEN:Pt an obese 56 yo in no acute distress  HEENT: normal  Neck: no JVD, no bruits Cardiac: RRR; no murmurs no LE edema  Respiratory:  clear to auscultation bilaterally,  GI: soft, nontender, nondistended, + BS  No hepatomegaly  MS: no deformity Moving all extremities   Skin: warm and dry, no rash Neuro:  Strength and sensation are intact Psych: euthymic mood, full affect   EKG:  EKG is ordered today. 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 Coronary calcium score of 2. This was 78th percentile for age and sex matched control.   2.  Normal coronary origin with right dominance.   3.  Minimal CAD.  CAD RADS 1.   4.  Consider non atherosclerotic causes of chest pain.   5. Recommend risk factor factor modification and preventive therapy.   6.  Basal septal hypertrophy of the interventricular septum.    Echo   06/2020  1. Left ventricular ejection fraction, by estimation, is 60 to 65%. The left ventricle has normal function. The left ventricle has no regional wall motion abnormalities. There is mild left ventricular hypertrophy. Left ventricular diastolic parameters are indeterminate. 2. Right ventricular systolic function is normal. The right ventricular size is normal. 3. The mitral valve is normal in structure. No evidence of mitral valve regurgitation. No evidence of mitral stenosis. 4. The aortic valve is tricuspid. Aortic valve regurgitation is not visualized. No aortic stenosis is present. 5. The inferior vena cava is normal in size with greater than 50% respiratory variability, suggesting right atrial pressure of 3 mmHg. Lipid  Panel No results found for: "CHOL", "TRIG", "HDL", "CHOLHDL", "VLDL", "LDLCALC", "LDLDIRECT"    Wt Readings from Last 3 Encounters:  02/10/22 215 lb 6.4 oz (97.7 kg)  06/30/20 217 lb 6.4 oz (98.6 kg)  10/04/17 220 lb (99.8 kg)      ASSESSMENT AND PLAN:  1 CAD   Minimal calcification   Rx risk factors   Patient asymptomatic  2  Abnormal EKG   Pt with persistent abnormal EKG with Q waves inferior and anterolateral leads  Echo shows normal LV funciton    Ca score is 2.       2  HL    Would keep on statin. Will repeat lipids    3 Obesity  Discussed diet      F/U Winter 2024/2025    Current medicines are reviewed at length with the patient today.  The patient does not have concerns regarding medicines.  Signed, Dorris Carnes, MD  02/10/2022 10:37 AM    [facility] [address], [place], [place]  [address] Phone: [phone]; Fax: [phone]

## 2022-02-22 ENCOUNTER — Other Ambulatory Visit: Payer: Self-pay | Admitting: Orthopedic Surgery

## 2022-02-22 DIAGNOSIS — M25511 Pain in right shoulder: Secondary | ICD-10-CM

## 2022-03-14 ENCOUNTER — Ambulatory Visit
Admission: RE | Admit: 2022-03-14 | Discharge: 2022-03-14 | Disposition: A | Discharge status: Home / Self Care | Payer: BC Managed Care – PPO | Source: Ambulatory Visit | Attending: Orthopedic Surgery | Admitting: Orthopedic Surgery

## 2022-03-14 DIAGNOSIS — M25511 Pain in right shoulder: Secondary | ICD-10-CM

## 2022-06-18 ENCOUNTER — Other Ambulatory Visit (HOSPITAL_COMMUNITY): Payer: Self-pay

## 2022-06-18 MED ORDER — TRULICITY 3 MG/0.5ML ~~LOC~~ SOAJ
3.0000 mg | SUBCUTANEOUS | 5 refills | Status: DC
  Filled 2022-06-18: qty 2, 28d supply, fill #0
  Filled 2022-07-12: qty 2, 28d supply, fill #1

## 2022-07-23 ENCOUNTER — Telehealth: Payer: Self-pay

## 2022-07-23 NOTE — Telephone Encounter (Signed)
LM for the pt to call back... she is over due to see Dr Harrington Challenger.. received labs from her PCP... NMR only drawn.. labs given to Dr Leonard Downing to review.

## 2022-07-25 NOTE — Progress Notes (Unsigned)
Cardiology Office Note   Date:  07/26/2022   ID:  Tara Oliver, DOB 1965/07/07, MRN [id number]  PCP:  Antony Contras, MD  Cardiologist:   Dorris Carnes, MD   Pt presents for follow up of HL and CAD    History of Present Illness: Tara Oliver is a 57 y.o. 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   I saw the pt  for the first time in 2022  Given EKG changes I recommended a CT coronary angiogram   This was done 07/21/20   Calcium score was 2 (78th percentile for age/sex)  Echo showed LVEF was normal     The pt denies CP   Breathing is OK      I saw the pt in Sept 2023   Since seen she denies CP   Breathing is OK  No palpitaitons  She was seen by B Balan recently   A1C over 8   Meds are being adjusted    PT admits to needing to do better with diet     Remains busy, stressed    Current Meds  Medication Sig   calcium carbonate (OSCAL) 1500 (600 Ca) MG TABS tablet Take 600 mg of elemental calcium by mouth daily at 6 (six) AM.   Cholecalciferol (VITAMIN D-3 PO) Take 1 capsule by mouth daily at 6 (six) AM.   FARXIGA 10 MG TABS tablet    loratadine (CLARITIN) 10 MG tablet Take 10 mg by mouth daily.   metFORMIN (GLUMETZA) 500 MG (MOD) 24 hr tablet Take 1,000 mg by mouth 2 (two) times daily with a meal.   MOUNJARO 7.5 MG/0.5ML Pen Inject 7.5 mg into the skin once a week.   Multiple Vitamin (MULTIVITAMIN ADULT) TABS Take 1 tablet by mouth daily.   ONETOUCH VERIO test strip    rosuvastatin (CRESTOR) 10 MG tablet Take 1 tablet (10 mg total) by mouth daily.   [DISCONTINUED] atorvastatin (LIPITOR) 20 MG tablet Take 1 tablet (20 mg total) by mouth daily.   [DISCONTINUED] Dulaglutide (TRULICITY) A999333 0000000 SOPN Inject 1.5 mg as directed once a week.   Current Facility-Administered Medications for the 07/26/22 encounter (Office Visit) with Fay Records, MD  Medication   0.9 %  sodium chloride  infusion     Allergies:   Azithromycin   Past Medical History:  Diagnosis Date   Allergy    Anemia    Diabetes mellitus    Hyperlipidemia     Past Surgical History:  Procedure Laterality Date   CESAREAN SECTION     x3   RETINAL DETACHMENT SURGERY       Social History:  The patient  reports that she has never smoked. She has never used smokeless tobacco. She reports current alcohol use. She reports that she does not use drugs.   Family History:  The patient's family history includes Colon cancer in an other family member; Stomach cancer in her maternal aunt.   Father had MI at 63   CVA (hemorrhagic at 73)   ROS:  Please see the history of present illness. All other systems are reviewed and  Negative to the above problem except as noted.    PHYSICAL EXAM: VS:  BP 108/78   Pulse (!) 114   Ht 5' 5.5" (1.664 m)   Wt 219 lb 3.2 oz (99.4 kg)  SpO2 96%   BMI 35.92 kg/m   GEN:Pt an obese 57 yo in no acute distress  HEENT: normal  Neck: no JVD, no bruits Cardiac: RRR; no murmurs.  NO  LE edema  Respiratory:  clear to auscultation bilaterally,  GI: soft, nontender, nondistended, + BS  MS: no deformity Moving all extremities   Skin: warm and dry, no rash Neuro:  Strength and sensation are intact Psych: euthymic mood, full affect   EKG:  No EKG  CT coronary angiogram 07/17/20 IMPRESSION 1. Coronary calcium score of 2. This was 78th percentile for age and sex matched control.   2.  Normal coronary origin with right dominance.   3.  Minimal CAD.  CAD RADS 1.   4.  Consider non atherosclerotic causes of chest pain.   5. Recommend risk factor factor modification and preventive therapy.   6.  Basal septal hypertrophy of the interventricular septum.    Echo   06/2020  1. Left ventricular ejection fraction, by estimation, is 60 to 65%. The left ventricle has normal function. The left ventricle has no regional wall motion abnormalities. There is mild left ventricular  hypertrophy. Left ventricular diastolic parameters are indeterminate. 2. Right ventricular systolic function is normal. The right ventricular size is normal. 3. The mitral valve is normal in structure. No evidence of mitral valve regurgitation. No evidence of mitral stenosis. 4. The aortic valve is tricuspid. Aortic valve regurgitation is not visualized. No aortic stenosis is present. 5. The inferior vena cava is normal in size with greater than 50% respiratory variability, suggesting right atrial pressure of 3 mmHg. Lipid Panel No results found for: "CHOL", "TRIG", "HDL", "CHOLHDL", "VLDL", "LDLCALC", "LDLDIRECT"    Wt Readings from Last 3 Encounters:  07/26/22 219 lb 3.2 oz (99.4 kg)  02/10/22 215 lb 6.4 oz (97.7 kg)  06/30/20 217 lb 6.4 oz (98.6 kg)      ASSESSMENT AND PLAN:  1 CAD   Minimal calcification  No symptoms  Manage risk factors    2  Abnormal EKG   Pt with persistent abnormal EKG with Q waves inferior and anterolateral leads  Echo shows normal LV funciton    Ca score is 2.       2  HL LDL LDL 109  HDL 47  Trig 159   LDL needs to be lower   Pt admits to not always remembering to take lipitor at night   Will switch to Crestor   Follow up lipomed in 8 wks   3  DM   Pt needs tighter control   Discussed apps (Zoe),   I would recomm getting a CGM for feedback on diet so she can make changes    Patient plans to go to a Wellness provider at [facility]    Current medicines are reviewed at length with the patient today.  The patient does not have concerns regarding medicines.  Signed, Dorris Carnes, MD  07/26/2022 10:39 PM    [facility] [address], [place],   [address] Phone: [phone]; Fax: [phone]

## 2022-07-26 ENCOUNTER — Encounter: Payer: Self-pay | Admitting: Internal Medicine

## 2022-07-26 ENCOUNTER — Ambulatory Visit: Payer: BC Managed Care – PPO | Attending: Internal Medicine | Admitting: Internal Medicine

## 2022-07-26 VITALS — BP 108/78 | HR 114 | Ht 65.5 in | Wt 219.2 lb

## 2022-07-26 DIAGNOSIS — Z79899 Other long term (current) drug therapy: Secondary | ICD-10-CM | POA: Diagnosis not present

## 2022-07-26 DIAGNOSIS — E785 Hyperlipidemia, unspecified: Secondary | ICD-10-CM | POA: Diagnosis not present

## 2022-07-26 MED ORDER — ROSUVASTATIN CALCIUM 10 MG PO TABS: 10.0000 mg | ORAL_TABLET | Freq: Every day | ORAL | 3 refills | Status: DC

## 2022-07-26 NOTE — Patient Instructions (Signed)
Medication Instructions:  STOP ATORVASTATIN START ROSUVASTATIN 10 MG A DAY  *If you need a refill on your cardiac medications before your next appointment, please call your pharmacy*   Lab Work: NMR, APO B, LIPO A, HGBA1C IN 8 WEEKS   If you have labs (blood work) drawn today and your tests are completely normal, you will receive your results only by: [place] (if you have MyChart) OR A paper copy in the mail If you have any lab test that is abnormal or we need to change your treatment, we will call you to review the results.   Testing/Procedures:    Follow-Up: At [facility], you and your health needs are our priority.  As part of our continuing mission to provide you with exceptional heart care, we have created designated Provider Care Teams.  These Care Teams include your primary Cardiologist (physician) and Advanced Practice Providers (APPs -  Physician Assistants and Nurse Practitioners) who all work together to provide you with the care you need, when you need it.  We recommend signing up for the patient portal called "MyChart".  Sign up information is provided on this After Visit Summary.  MyChart is used to connect with patients for Virtual Visits (Telemedicine).  Patients are able to view lab/test results, encounter notes, upcoming appointments, etc.  Non-urgent messages can be sent to your provider as well.   To learn more about what you can do with MyChart, go to [email or web address].    Your next appointment:   9 month(s)  DR Dorris Carnes     Other Instructions

## 2022-07-26 NOTE — Telephone Encounter (Signed)
Pt seen 07/26/22. Will close this encounter.

## 2022-07-27 NOTE — Telephone Encounter (Signed)
At the pts OV yesterday her med list reflected Atorvastatin 20 mg today she reports after checking her meds at home she actually has been taking Rosuvastatin 20 mg daily.   Will forward to Dr Harrington Challenger for review.

## 2022-08-18 ENCOUNTER — Encounter: Payer: Self-pay | Admitting: Gastroenterology

## 2022-09-17 ENCOUNTER — Other Ambulatory Visit (HOSPITAL_COMMUNITY): Payer: Self-pay

## 2022-09-17 MED ORDER — MOUNJARO 7.5 MG/0.5ML ~~LOC~~ SOAJ
7.5000 mg | SUBCUTANEOUS | 3 refills | Status: DC
  Filled 2022-09-17: qty 2, 28d supply, fill #0
  Filled 2022-10-13: qty 2, 28d supply, fill #1
  Filled 2022-11-19: qty 2, 28d supply, fill #2
  Filled 2022-12-13: qty 2, 28d supply, fill #3

## 2022-09-23 ENCOUNTER — Ambulatory Visit (AMBULATORY_SURGERY_CENTER): Payer: BC Managed Care – PPO | Admitting: *Deleted

## 2022-09-23 ENCOUNTER — Encounter: Payer: Self-pay | Admitting: Gastroenterology

## 2022-09-23 VITALS — Ht 65.5 in | Wt 212.0 lb

## 2022-09-23 DIAGNOSIS — Z86010 Personal history of colonic polyps: Secondary | ICD-10-CM

## 2022-09-23 MED ORDER — NA SULFATE-K SULFATE-MG SULF 17.5-3.13-1.6 GM/177ML PO SOLN: 1.0000 | Freq: Once | ORAL | 0 refills | Status: AC

## 2022-09-23 NOTE — Progress Notes (Signed)
Pt's pre-visit is done over the phone and all paperwork (prep instructions) sent to patient. Pt's name and DOB verified at the beginning of the pre-visit. Pt denies any difficulty with ambulating.  No egg or soy allergy known to patient  No issues known to pt with past sedation with any surgeries or procedures Patient denies ever being intubated Pt has no issues moving head neck or swallowing No FH of Malignant Hyperthermia Pt is not on diet pills Pt is not on home 02  Pt is not on blood thinners   Pt has frequent issues with constipation RN instructed pt to use Miralax per bottles instructions a week before prep days. Pt states they will Pt is not on dialysis Pt denies any upcoming cardiac testing Pt encouraged to use to use Singlecare or Goodrx to reduce cost  Patient's chart reviewed by Cathlyn Parsons CNRA prior to pre-visit and patient appropriate for the [facility].  Pre-visit completed and red dot placed by patient's name on their procedure day (on provider's schedule).  . Visit by phone Pt states  weight is 212 lb Instructions reviewed with pt and pt states understanding. Instructed to review again prior to procedure. Pt states they will.  Instructions sent by mail with coupon and by my chart

## 2022-10-14 ENCOUNTER — Encounter: Payer: Self-pay | Admitting: Certified Registered Nurse Anesthetist

## 2022-10-20 ENCOUNTER — Encounter: Payer: Self-pay | Admitting: Gastroenterology

## 2022-10-20 ENCOUNTER — Ambulatory Visit (AMBULATORY_SURGERY_CENTER): Payer: BC Managed Care – PPO | Admitting: Gastroenterology

## 2022-10-20 VITALS — BP 121/77 | HR 76 | Temp 98.6°F | Resp 11 | Ht 65.5 in | Wt 212.0 lb

## 2022-10-20 DIAGNOSIS — Z86010 Personal history of colonic polyps: Secondary | ICD-10-CM

## 2022-10-20 DIAGNOSIS — D122 Benign neoplasm of ascending colon: Secondary | ICD-10-CM | POA: Diagnosis not present

## 2022-10-20 DIAGNOSIS — K635 Polyp of colon: Secondary | ICD-10-CM | POA: Diagnosis not present

## 2022-10-20 DIAGNOSIS — D128 Benign neoplasm of rectum: Secondary | ICD-10-CM

## 2022-10-20 DIAGNOSIS — D124 Benign neoplasm of descending colon: Secondary | ICD-10-CM

## 2022-10-20 DIAGNOSIS — K621 Rectal polyp: Secondary | ICD-10-CM | POA: Diagnosis not present

## 2022-10-20 DIAGNOSIS — D127 Benign neoplasm of rectosigmoid junction: Secondary | ICD-10-CM

## 2022-10-20 DIAGNOSIS — D125 Benign neoplasm of sigmoid colon: Secondary | ICD-10-CM

## 2022-10-20 DIAGNOSIS — D123 Benign neoplasm of transverse colon: Secondary | ICD-10-CM | POA: Diagnosis not present

## 2022-10-20 DIAGNOSIS — Z09 Encounter for follow-up examination after completed treatment for conditions other than malignant neoplasm: Secondary | ICD-10-CM | POA: Diagnosis present

## 2022-10-20 MED ORDER — SODIUM CHLORIDE 0.9 % IV SOLN: 500.0000 mL | Freq: Once | INTRAVENOUS | Status: DC

## 2022-10-20 NOTE — Progress Notes (Unsigned)
Pt's states no medical or surgical changes since previsit or office visit. 

## 2022-10-20 NOTE — Op Note (Signed)
[facility] Patient Name: Jonathon Door Procedure Date: 10/20/2022 11:21 AM MRN: [id number] Endoscopist: [name] , MD, [id number] Age: 57 Referring MD:  Date of Birth: 08-04-1965 Gender: Female Account #: [id number] Procedure:                Colonoscopy Indications:              Surveillance: Personal history of adenomatous                            polyps on last colonoscopy 5 years ago Medicines:                Monitored Anesthesia Care Procedure:                Pre-Anesthesia Assessment:                           - Prior to the procedure, a History and Physical                            was performed, and patient medications and                            allergies were reviewed. The patient's tolerance of                            previous anesthesia was also reviewed. The risks                            and benefits of the procedure and the sedation                            options and risks were discussed with the patient.                            All questions were answered, and informed consent                            was obtained. Prior Anticoagulants: The patient has                            taken no anticoagulant or antiplatelet agents. ASA                            Grade Assessment: II - A patient with mild systemic                            disease. After reviewing the risks and benefits,                            the patient was deemed in satisfactory condition to                            undergo the procedure.  After obtaining informed consent, the colonoscope                            was passed under direct vision. Throughout the                            procedure, the patient's blood pressure, pulse, and                            oxygen saturations were monitored continuously. The                            CF [id number] #[id number] was introduced through the anus                            and advanced to the  3 cm into the ileum. The                            colonoscopy was performed without difficulty. The                            patient tolerated the procedure. The quality of the                            bowel preparation was good. The terminal ileum,                            ileocecal valve, appendiceal orifice, and rectum                            were photographed. Scope In: 11:26:54 AM Scope Out: 11:46:34 AM Scope Withdrawal Time: 0 hours 16 minutes 14 seconds  Total Procedure Duration: 0 hours 19 minutes 40 seconds  Findings:                 The digital rectal exam findings include                            hemorrhoids. Pertinent negatives include no                            palpable rectal lesions.                           The terminal ileum and ileocecal valve appeared                            normal.                           Five sessile polyps were found in the recto-sigmoid                            colon, sigmoid colon, descending colon, transverse  colon and ascending colon. The polyps were 2 to 10                            mm in size. These polyps were removed with a cold                            snare. Resection and retrieval were complete.                           An 8 mm polyp was found in the distal rectum. The                            polyp was sessile. The polyp was removed with a                            cold snare. Resection and retrieval were complete.                           Normal mucosa was found in the entire colon                           otherwise.                           Non-bleeding non-thrombosed external and internal                            hemorrhoids were found during retroflexion, during                            perianal exam and during digital exam. The                            hemorrhoids were Grade II (internal hemorrhoids                            that prolapse but reduce  spontaneously). Complications:            No immediate complications. Estimated Blood Loss:     Estimated blood loss was minimal. Impression:               - Hemorrhoids found on digital rectal exam.                           - The examined portion of the ileum was normal.                           - Five 2 to 10 mm polyps at the recto-sigmoid                            colon, in the sigmoid colon, in the descending                            colon, in the transverse colon and  in the ascending                            colon, removed with a cold snare. Resected and                            retrieved.                           - One 8 mm polyp in the distal rectum, removed with                            a cold snare. Resected and retrieved.                           - Normal mucosa in the entire examined colon                            otherwise.                           - Non-bleeding non-thrombosed external and internal                            hemorrhoids. Recommendation:           - The patient will be observed post-procedure,                            until all discharge criteria are met.                           - Discharge patient to home.                           - Patient has a contact number available for                            emergencies. The signs and symptoms of potential                            delayed complications were discussed with the                            patient. Return to normal activities tomorrow.                            Written discharge instructions were provided to the                            patient.                           - High fiber diet.                           - Use FiberCon 1-2 tablets PO daily.                           -  Continue present medications.                           - Await pathology results.                           - Repeat colonoscopy in 3 years for surveillance.                           - The findings  and recommendations were discussed                            with the patient.                           - The findings and recommendations were discussed                            with the patient's family. [name], MD 10/20/2022 11:50:43 AM

## 2022-10-20 NOTE — Progress Notes (Unsigned)
GASTROENTEROLOGY PROCEDURE H&P NOTE   Primary Care Physician: Tara Joe, MD  HPI: Tara Oliver is a 57 y.o. female who presents for Colonoscopy for surveillance of previous TAs and SSPs.  Past Medical History:  Diagnosis Date   Allergy    Anemia    Cataract    Diabetes mellitus    Glaucoma    Hyperlipidemia    Osteopenia    Past Surgical History:  Procedure Laterality Date   CESAREAN SECTION     x3   RETINAL DETACHMENT SURGERY     Current Outpatient Medications  Medication Sig Dispense Refill   calcium carbonate (OSCAL) 1500 (600 Ca) MG TABS tablet Take 600 mg of elemental calcium by mouth daily at 6 (six) AM. (Patient not taking: Reported on 09/23/2022)     Cholecalciferol (VITAMIN D-3 PO) Take 1 capsule by mouth daily at 6 (six) AM. (Patient not taking: Reported on 09/23/2022)     FARXIGA 10 MG TABS tablet      loratadine (CLARITIN) 10 MG tablet Take 10 mg by mouth daily.     metFORMIN (GLUMETZA) 500 MG (MOD) 24 hr tablet Take 1,000 mg by mouth 2 (two) times daily with a meal. Total of 200 mg QD     Multiple Vitamin (MULTIVITAMIN ADULT) TABS Take 1 tablet by mouth daily.     ONETOUCH VERIO test strip   11   rosuvastatin (CRESTOR) 10 MG tablet Take 1 tablet (10 mg total) by mouth daily. 90 tablet 3   tirzepatide (MOUNJARO) 7.5 MG/0.5ML Pen Inject 7.5 mg into the skin once a week. 2 mL 3   Current Facility-Administered Medications  Medication Dose Route Frequency Provider Last Rate Last Admin   0.9 %  sodium chloride infusion  500 mL Intravenous Once Rachael Fee, MD        Current Outpatient Medications:    calcium carbonate (OSCAL) 1500 (600 Ca) MG TABS tablet, Take 600 mg of elemental calcium by mouth daily at 6 (six) AM. (Patient not taking: Reported on 09/23/2022), Disp: , Rfl:    Cholecalciferol (VITAMIN D-3 PO), Take 1 capsule by mouth daily at 6 (six) AM. (Patient not taking: Reported on 09/23/2022), Disp: , Rfl:    FARXIGA 10 MG TABS tablet, , Disp: , Rfl:     loratadine (CLARITIN) 10 MG tablet, Take 10 mg by mouth daily., Disp: , Rfl:    metFORMIN (GLUMETZA) 500 MG (MOD) 24 hr tablet, Take 1,000 mg by mouth 2 (two) times daily with a meal. Total of 200 mg QD, Disp: , Rfl:    Multiple Vitamin (MULTIVITAMIN ADULT) TABS, Take 1 tablet by mouth daily., Disp: , Rfl:    ONETOUCH VERIO test strip, , Disp: , Rfl: 11   rosuvastatin (CRESTOR) 10 MG tablet, Take 1 tablet (10 mg total) by mouth daily., Disp: 90 tablet, Rfl: 3   tirzepatide (MOUNJARO) 7.5 MG/0.5ML Pen, Inject 7.5 mg into the skin once a week., Disp: 2 mL, Rfl: 3  Current Facility-Administered Medications:    0.9 %  sodium chloride infusion, 500 mL, Intravenous, Once, Rachael Fee, MD Allergies  Allergen Reactions   Azithromycin Other (See Comments)    Stomach cramps/ pains    Family History  Problem Relation Age of Onset   Stomach cancer Maternal Aunt    Colon cancer Other    Colon polyps Neg Hx    Esophageal cancer Neg Hx    Rectal cancer Neg Hx    Social History   Socioeconomic  History   Marital status: Married    Spouse name: Not on file   Number of children: Not on file   Years of education: Not on file   Highest education level: Not on file  Occupational History   Not on file  Tobacco Use   Smoking status: Never   Smokeless tobacco: Never  Vaping Use   Vaping Use: Never used  Substance and Sexual Activity   Alcohol use: Yes    Comment: rare    Drug use: Never   Sexual activity: Not on file  Other Topics Concern   Not on file  Social History Narrative   Not on file   Social Determinants of Health   Financial Resource Strain: Not on file  Food Insecurity: Not on file  Transportation Needs: Not on file  Physical Activity: Not on file  Stress: Not on file  Social Connections: Not on file  Intimate Partner Violence: Not on file    Physical Exam: There were no vitals filed for this visit. There is no height or weight on file to calculate BMI. GEN:  NAD EYE: Sclerae anicteric ENT: MMM CV: Non-tachycardic GI: Soft, NT/ND NEURO:  Alert & Oriented x 3  Lab Results: No results for input(s): "WBC", "HGB", "HCT", "PLT" in the last 72 hours. BMET No results for input(s): "NA", "K", "CL", "CO2", "GLUCOSE", "BUN", "CREATININE", "CALCIUM" in the last 72 hours. LFT No results for input(s): "PROT", "ALBUMIN", "AST", "ALT", "ALKPHOS", "BILITOT", "BILIDIR", "IBILI" in the last 72 hours. PT/INR No results for input(s): "LABPROT", "INR" in the last 72 hours.   Impression / Plan: This is a 57 y.o.female who presents for Colonoscopy for surveillance of previous TAs and SSPs.  The risks and benefits of endoscopic evaluation/treatment were discussed with the patient and/or family; these include but are not limited to the risk of perforation, infection, bleeding, missed lesions, lack of diagnosis, severe illness requiring hospitalization, as well as anesthesia and sedation related illnesses.  The patient's history has been reviewed, patient examined, no change in status, and deemed stable for procedure.  The patient and/or family is agreeable to proceed.    [name], MD  Gastroenterology Advanced Endoscopy Office # [phone]

## 2022-10-20 NOTE — Patient Instructions (Addendum)
Recommendation: - The patient will be observed post-procedure,                            until all discharge criteria are met.                           - Discharge patient to home.                           - Patient has a contact number available for                            emergencies. The signs and symptoms of potential                            delayed complications were discussed with the                            patient. Return to normal activities tomorrow.                            Written discharge instructions were provided to the                            patient.                           - High fiber diet.                           - Use FiberCon 1-2 tablets PO daily.                           - Continue present medications.                           - Await pathology results.                           - Repeat colonoscopy in 3 years for surveillance.                           - The findings and recommendations were discussed                            with the patient.                           - The findings and recommendations were discussed                            with the patient's family.  HANDOUTS ON HIGH FIBER DIET, HEMORRHOIDS AND POLYPS GIVEN.  YOU HAD AN ENDOSCOPIC PROCEDURE TODAY AT THE [facility]:   Refer to the procedure report that was given to you for any specific questions about what was found during the examination.  If the  procedure report does not answer your questions, please call your gastroenterologist to clarify.  If you requested that your care partner not be given the details of your procedure findings, then the procedure report has been included in a sealed envelope for you to review at your convenience later.  YOU SHOULD EXPECT: Some feelings of bloating in the abdomen. Passage of more gas than usual.  Walking can help get rid of the air that was put into your GI tract during the procedure and reduce the bloating. If you had a  lower endoscopy (such as a colonoscopy or flexible sigmoidoscopy) you may notice spotting of blood in your stool or on the toilet paper. If you underwent a bowel prep for your procedure, you may not have a normal bowel movement for a few days.  Please Note:  You might notice some irritation and congestion in your nose or some drainage.  This is from the oxygen used during your procedure.  There is no need for concern and it should clear up in a day or so.  SYMPTOMS TO REPORT IMMEDIATELY:  Following lower endoscopy (colonoscopy or flexible sigmoidoscopy):  Excessive amounts of blood in the stool  Significant tenderness or worsening of abdominal pains  Swelling of the abdomen that is new, acute  Fever of 100F or higher  For urgent or emergent issues, a gastroenterologist can be reached at any hour by calling [phone]. Do not use MyChart messaging for urgent concerns.    DIET:  We do recommend a small meal at first, but then you may proceed to your regular diet.  Drink plenty of fluids but you should avoid alcoholic beverages for 24 hours.  ACTIVITY:  You should plan to take it easy for the rest of today and you should NOT DRIVE or use heavy machinery until tomorrow (because of the sedation medicines used during the test).    FOLLOW UP: Our staff will call the number listed on your records the next business day following your procedure.  We will call around 7:15- 8:00 am to check on you and address any questions or concerns that you may have regarding the information given to you following your procedure. If we do not reach you, we will leave a message.     If any biopsies were taken you will be contacted by phone or by letter within the next 1-3 weeks.  Please call us at [phone] if you have not heard about the biopsies in 3 weeks.    SIGNATURES/CONFIDENTIALITY: You and/or your care partner have signed paperwork which will be entered into your electronic medical record.  These  signatures attest to the fact that that the information above on your After Visit Summary has been reviewed and is understood.  Full responsibility of the confidentiality of this discharge information lies with you and/or your care-partner.

## 2022-10-20 NOTE — Progress Notes (Signed)
Called to room to assist during endoscopic procedure.  Patient ID and intended procedure confirmed with present staff. Received instructions for my participation in the procedure from the performing physician.  

## 2022-10-21 ENCOUNTER — Telehealth: Payer: Self-pay

## 2022-10-21 NOTE — Telephone Encounter (Signed)
  Follow up Call-     10/20/2022   11:00 AM  Call back number  Post procedure Call Back phone  # [phone]  Permission to leave phone message Yes     Patient questions:  Do you have a fever, pain , or abdominal swelling? No. Pain Score  0 *  Have you tolerated food without any problems? Yes.    Have you been able to return to your normal activities? Yes.    Do you have any questions about your discharge instructions: Diet   No. Medications  No. Follow up visit  No.  Do you have questions or concerns about your Care? No.  Actions: * If pain score is 4 or above: No action needed, pain <4.

## 2022-10-24 ENCOUNTER — Encounter: Payer: Self-pay | Admitting: Gastroenterology

## 2022-11-19 ENCOUNTER — Other Ambulatory Visit (HOSPITAL_COMMUNITY): Payer: Self-pay

## 2022-12-06 ENCOUNTER — Encounter (INDEPENDENT_AMBULATORY_CARE_PROVIDER_SITE_OTHER): Payer: BC Managed Care – PPO | Admitting: Ophthalmology

## 2023-01-26 ENCOUNTER — Other Ambulatory Visit (HOSPITAL_COMMUNITY): Payer: Self-pay

## 2023-01-26 MED ORDER — MOUNJARO 7.5 MG/0.5ML ~~LOC~~ SOAJ
7.5000 mg | SUBCUTANEOUS | 3 refills | Status: DC
  Filled 2023-01-26: qty 2, 28d supply, fill #0
  Filled 2023-02-21: qty 2, 28d supply, fill #1
  Filled 2023-03-20: qty 2, 28d supply, fill #2
  Filled 2023-04-19: qty 2, 28d supply, fill #3

## 2023-02-21 ENCOUNTER — Other Ambulatory Visit (HOSPITAL_COMMUNITY): Payer: Self-pay

## 2023-02-22 ENCOUNTER — Other Ambulatory Visit (HOSPITAL_COMMUNITY): Payer: Self-pay

## 2023-04-19 ENCOUNTER — Other Ambulatory Visit (HOSPITAL_COMMUNITY): Payer: Self-pay

## 2023-05-17 ENCOUNTER — Other Ambulatory Visit (HOSPITAL_COMMUNITY): Payer: Self-pay

## 2023-05-18 ENCOUNTER — Other Ambulatory Visit (HOSPITAL_COMMUNITY): Payer: Self-pay

## 2023-05-19 ENCOUNTER — Other Ambulatory Visit (HOSPITAL_COMMUNITY): Payer: Self-pay

## 2023-05-19 MED ORDER — MOUNJARO 7.5 MG/0.5ML ~~LOC~~ SOAJ
7.5000 mg | SUBCUTANEOUS | 6 refills | Status: DC
  Filled 2023-05-19: qty 2, 28d supply, fill #0
  Filled 2023-06-11: qty 2, 28d supply, fill #1
  Filled 2023-07-11: qty 2, 28d supply, fill #2
  Filled 2023-08-08: qty 2, 28d supply, fill #3
  Filled 2023-09-08: qty 2, 28d supply, fill #4
  Filled 2023-10-03: qty 2, 28d supply, fill #5
  Filled 2023-11-03: qty 2, 28d supply, fill #6

## 2023-06-12 NOTE — Progress Notes (Signed)
 Cardiology Office Note   Date:  06/14/2023   ID:  Tara Oliver, DOB March 17, 1966, MRN [id number]  PCP:  Seabron Lenis, MD  Cardiologist:   Vina Gull, MD   Pt presents for follow up of HL and CAD    History of Present Illness: Tara Oliver is a 58 y.o. 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   Current Facility-Administered Medications for the 06/14/23 encounter (Office Visit) with Gull Vina GAILS, MD  Medication   0.9 %  sodium chloride  infusion     Allergies:   Azithromycin   Past Medical History:  Diagnosis Date   Allergy    Anemia    Cataract    Diabetes mellitus    Glaucoma    Hyperlipidemia    Osteopenia     Past Surgical History:  Procedure Laterality Date   CESAREAN SECTION     x3   RETINAL DETACHMENT SURGERY       Social History:  The patient  reports that she has never smoked. She has never used smokeless tobacco. She reports current alcohol use. She reports that she does not use drugs.   Family History:  The patient's family history includes Colon cancer in an other family member; Stomach cancer in her maternal aunt.   Father had MI at 31   CVA (hemorrhagic at 5)   ROS:  Please see the history of present illness. All other systems are reviewed and  Negative to the above problem except as noted.     PHYSICAL EXAM: VS:  BP 118/78 (BP Location: Left Arm, Patient Position: Sitting, Cuff Size: Large)   Pulse 62   Resp 16   Ht 5' 5 (1.651 m)   Wt 206 lb 3.2 oz (93.5 kg)   LMP 09/14/2017 (Approximate)   SpO2 97%   BMI 34.31 kg/m   GEN:Pt an obese 58 yo in no acute distress  HEENT: normal  Neck: no JVD, no bruits Cardiac: RRR; no murmurs  No  LE edema  Respiratory:  clear to auscultation GI: soft, nontender  No hepatomegaly MS: Moving all extremities    EKG:  NSR 93 bpm   IWMI   Anterolateral MI   CT coronary angiogram 07/17/20 IMPRESSION 1. Coronary calcium  score of 2. This was 78th percentile for age and sex matched control.   2.  Normal coronary origin with right dominance.   3.  Minimal CAD.  CAD RADS 1.   4.  Consider non atherosclerotic causes of chest pain.   5. Recommend risk factor factor modification and preventive therapy.   6.  Basal septal hypertrophy of the  interventricular septum.    Echo   06/2020  1. Left ventricular ejection fraction, by estimation, is 60 to 65%. The left ventricle has normal function. The left ventricle has no regional wall motion abnormalities. There is mild left ventricular hypertrophy. Left ventricular diastolic parameters are indeterminate. 2. Right ventricular systolic function is normal. The right ventricular size is normal. 3. The mitral valve is normal in structure. No evidence of mitral valve regurgitation. No evidence of mitral stenosis. 4. The aortic valve is tricuspid. Aortic valve regurgitation is not visualized. No aortic stenosis is present. 5. The inferior vena cava is normal in size with greater than 50% respiratory variability, suggesting right atrial pressure of 3 mmHg. Lipid Panel No results found for: CHOL, TRIG, HDL, CHOLHDL, VLDL, LDLCALC, LDLDIRECT    Wt Readings from Last 3 Encounters:  06/14/23 206 lb 3.2 oz (93.5 kg)  10/20/22 212 lb (96.2 kg)  09/23/22 212 lb (96.2 kg)       ASSESSMENT AND PLAN:  1 CAD   Minimal CAD on CCTA in 2022  Remains asymptomatic  Manage risk factors    2  Abnormal EKG   Pt remains abnormal with Q waves inferior and anterolateral leads    2  HL Pt on 20 mg Crestor    Will check lipomed and apoB  3  DM   A1C was 7.2 in October   Keep working on diet    Follow up in 1 year   Sooner if problems arise   Current medicines are reviewed at length with the patient today.  The patient does not have concerns regarding medicines.  Signed, Vina Gull, MD  06/14/2023 9:27 AM    [facility] [address] [place], [place], [place]  [address] Phone: [phone]; Fax: [phone]

## 2023-06-13 ENCOUNTER — Other Ambulatory Visit (HOSPITAL_COMMUNITY): Payer: Self-pay

## 2023-06-14 ENCOUNTER — Other Ambulatory Visit (HOSPITAL_COMMUNITY): Payer: Self-pay

## 2023-06-14 ENCOUNTER — Encounter: Payer: Self-pay | Admitting: Internal Medicine

## 2023-06-14 ENCOUNTER — Ambulatory Visit: Payer: 59 | Attending: Internal Medicine | Admitting: Internal Medicine

## 2023-06-14 VITALS — BP 118/78 | HR 62 | Resp 16 | Ht 65.0 in | Wt 206.2 lb

## 2023-06-14 DIAGNOSIS — Z79899 Other long term (current) drug therapy: Secondary | ICD-10-CM

## 2023-06-14 DIAGNOSIS — E785 Hyperlipidemia, unspecified: Secondary | ICD-10-CM

## 2023-06-14 NOTE — Patient Instructions (Signed)
 Medication Instructions:   *If you need a refill on your cardiac medications before your next appointment, please call your pharmacy*   Lab Work: NMR, APO B, LIPO A  If you have labs (blood work) drawn today and your tests are completely normal, you will receive your results only by: MyChart Message (if you have MyChart) OR A paper copy in the mail If you have any lab test that is abnormal or we need to change your treatment, we will call you to review the results.   Testing/Procedures:    Follow-Up: At [facility], you and your health needs are our priority.  As part of our continuing mission to provide you with exceptional heart care, we have created designated Provider Care Teams.  These Care Teams include your primary Cardiologist (physician) and Advanced Practice Providers (APPs -  Physician Assistants and Nurse Practitioners) who all work together to provide you with the care you need, when you need it.  We recommend signing up for the patient portal called MyChart.  Sign up information is provided on this After Visit Summary.  MyChart is used to connect with patients for Virtual Visits (Telemedicine).  Patients are able to view lab/test results, encounter notes, upcoming appointments, etc.  Non-urgent messages can be sent to your provider as well.   To learn more about what you can do with MyChart, go to [email or web address].    Your next appointment:  ONE YEAR WITH DR OKEY

## 2023-06-15 LAB — NMR, LIPOPROFILE
Cholesterol, Total: 172 mg/dL (ref 100–199)
HDL Particle Number: 39.1 umol/L (ref >=30.5)
HDL-C: 49 mg/dL (ref >39)
LDL Particle Number: 1171 nmol/L — ABNORMAL HIGH (ref <1000)
LDL Size: 21.2 nmol (ref >20.5)
LDL-C (NIH Calc): 99 mg/dL (ref 0–99)
LP-IR Score: 56 — ABNORMAL HIGH (ref <=45)
Small LDL Particle Number: 372 nmol/L (ref <=527)
Triglycerides: 135 mg/dL (ref 0–149)

## 2023-06-15 LAB — APOLIPOPROTEIN B: Apolipoprotein B: 92 mg/dL — ABNORMAL HIGH (ref <90)

## 2023-06-15 LAB — LIPOPROTEIN A (LPA): Lipoprotein (a): 181 nmol/L — ABNORMAL HIGH (ref <75.0)

## 2023-06-16 ENCOUNTER — Other Ambulatory Visit: Payer: Self-pay | Admitting: Obstetrics and Gynecology

## 2023-06-16 DIAGNOSIS — Z9189 Other specified personal risk factors, not elsewhere classified: Secondary | ICD-10-CM

## 2023-06-21 ENCOUNTER — Other Ambulatory Visit: Payer: Self-pay

## 2023-06-21 DIAGNOSIS — E785 Hyperlipidemia, unspecified: Secondary | ICD-10-CM

## 2023-06-21 DIAGNOSIS — Z79899 Other long term (current) drug therapy: Secondary | ICD-10-CM

## 2023-06-21 MED ORDER — EZETIMIBE 10 MG PO TABS: 10.0000 mg | ORAL_TABLET | Freq: Every day | ORAL | 3 refills | Status: AC

## 2023-06-21 NOTE — Telephone Encounter (Signed)
 Attempted to call the pt but unable to leave a message due to her mailbox being full.

## 2023-10-04 ENCOUNTER — Ambulatory Visit
Admission: RE | Admit: 2023-10-04 | Discharge: 2023-10-04 | Disposition: A | Discharge status: Home / Self Care | Payer: 59 | Source: Ambulatory Visit | Attending: Obstetrics and Gynecology | Admitting: Obstetrics and Gynecology

## 2023-10-04 DIAGNOSIS — Z9189 Other specified personal risk factors, not elsewhere classified: Secondary | ICD-10-CM

## 2023-10-04 MED ORDER — GADOPICLENOL 0.5 MMOL/ML IV SOLN
9.0000 mL | Freq: Once | INTRAVENOUS | Status: AC | PRN
  Administered 2023-10-04: 9 mL via INTRAVENOUS

## 2023-12-01 ENCOUNTER — Other Ambulatory Visit (HOSPITAL_COMMUNITY): Payer: Self-pay

## 2023-12-01 MED ORDER — MOUNJARO 7.5 MG/0.5ML ~~LOC~~ SOAJ
7.5000 mg | SUBCUTANEOUS | 6 refills | Status: AC
  Filled 2023-12-01: qty 2, 28d supply, fill #0
  Filled 2023-12-26: qty 2, 28d supply, fill #1
  Filled 2024-01-26: qty 2, 28d supply, fill #2
  Filled 2024-02-20: qty 2, 28d supply, fill #3

## 2024-02-21 ENCOUNTER — Other Ambulatory Visit (HOSPITAL_COMMUNITY): Payer: Self-pay

## 2024-02-21 MED ORDER — MOUNJARO 10 MG/0.5ML ~~LOC~~ SOAJ
10.0000 mg | SUBCUTANEOUS | 5 refills | Status: AC
  Filled 2024-02-21 – 2024-04-20 (×3): qty 2, 28d supply, fill #0
  Filled 2024-05-15: qty 2, 28d supply, fill #1
  Filled 2024-06-11: qty 2, 28d supply, fill #2
  Filled 2024-07-11: qty 2, 28d supply, fill #3

## 2024-02-21 MED ORDER — MOUNJARO 10 MG/0.5ML ~~LOC~~ SOAJ
10.0000 mg | SUBCUTANEOUS | 5 refills | Status: AC
  Filled 2024-02-21 – 2024-02-22 (×2): qty 2, 28d supply, fill #0
  Filled 2024-03-19: qty 2, 28d supply, fill #1

## 2024-02-22 ENCOUNTER — Other Ambulatory Visit (HOSPITAL_COMMUNITY): Payer: Self-pay

## 2024-02-23 ENCOUNTER — Other Ambulatory Visit (HOSPITAL_COMMUNITY): Payer: Self-pay

## 2024-03-20 ENCOUNTER — Other Ambulatory Visit: Payer: Self-pay

## 2024-03-20 ENCOUNTER — Other Ambulatory Visit (HOSPITAL_COMMUNITY): Payer: Self-pay

## 2024-04-20 ENCOUNTER — Other Ambulatory Visit (HOSPITAL_COMMUNITY): Payer: Self-pay

## 2024-05-01 ENCOUNTER — Encounter: Payer: Self-pay | Admitting: Internal Medicine

## 2024-05-15 ENCOUNTER — Other Ambulatory Visit (HOSPITAL_COMMUNITY): Payer: Self-pay
